# Patient Record
Sex: Female | Born: 1995 | Race: White | Hispanic: No | Smoking: Former smoker
Health system: Southern US, Community
[De-identification: ages and names within clinical notes are randomized; demographics above are authoritative.]

---
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
## 2016-07-05 IMAGING — CR DG CHEST 2V
2 series · 2 of 2 positions shown · non-contrast
Comparison: None

CLINICAL DATA: Chest pain for 1 week, pain moves from RIGHT to LEFT
anterior chest, former smoker

EXAM:
CHEST  2 VIEW

[w chest pa]
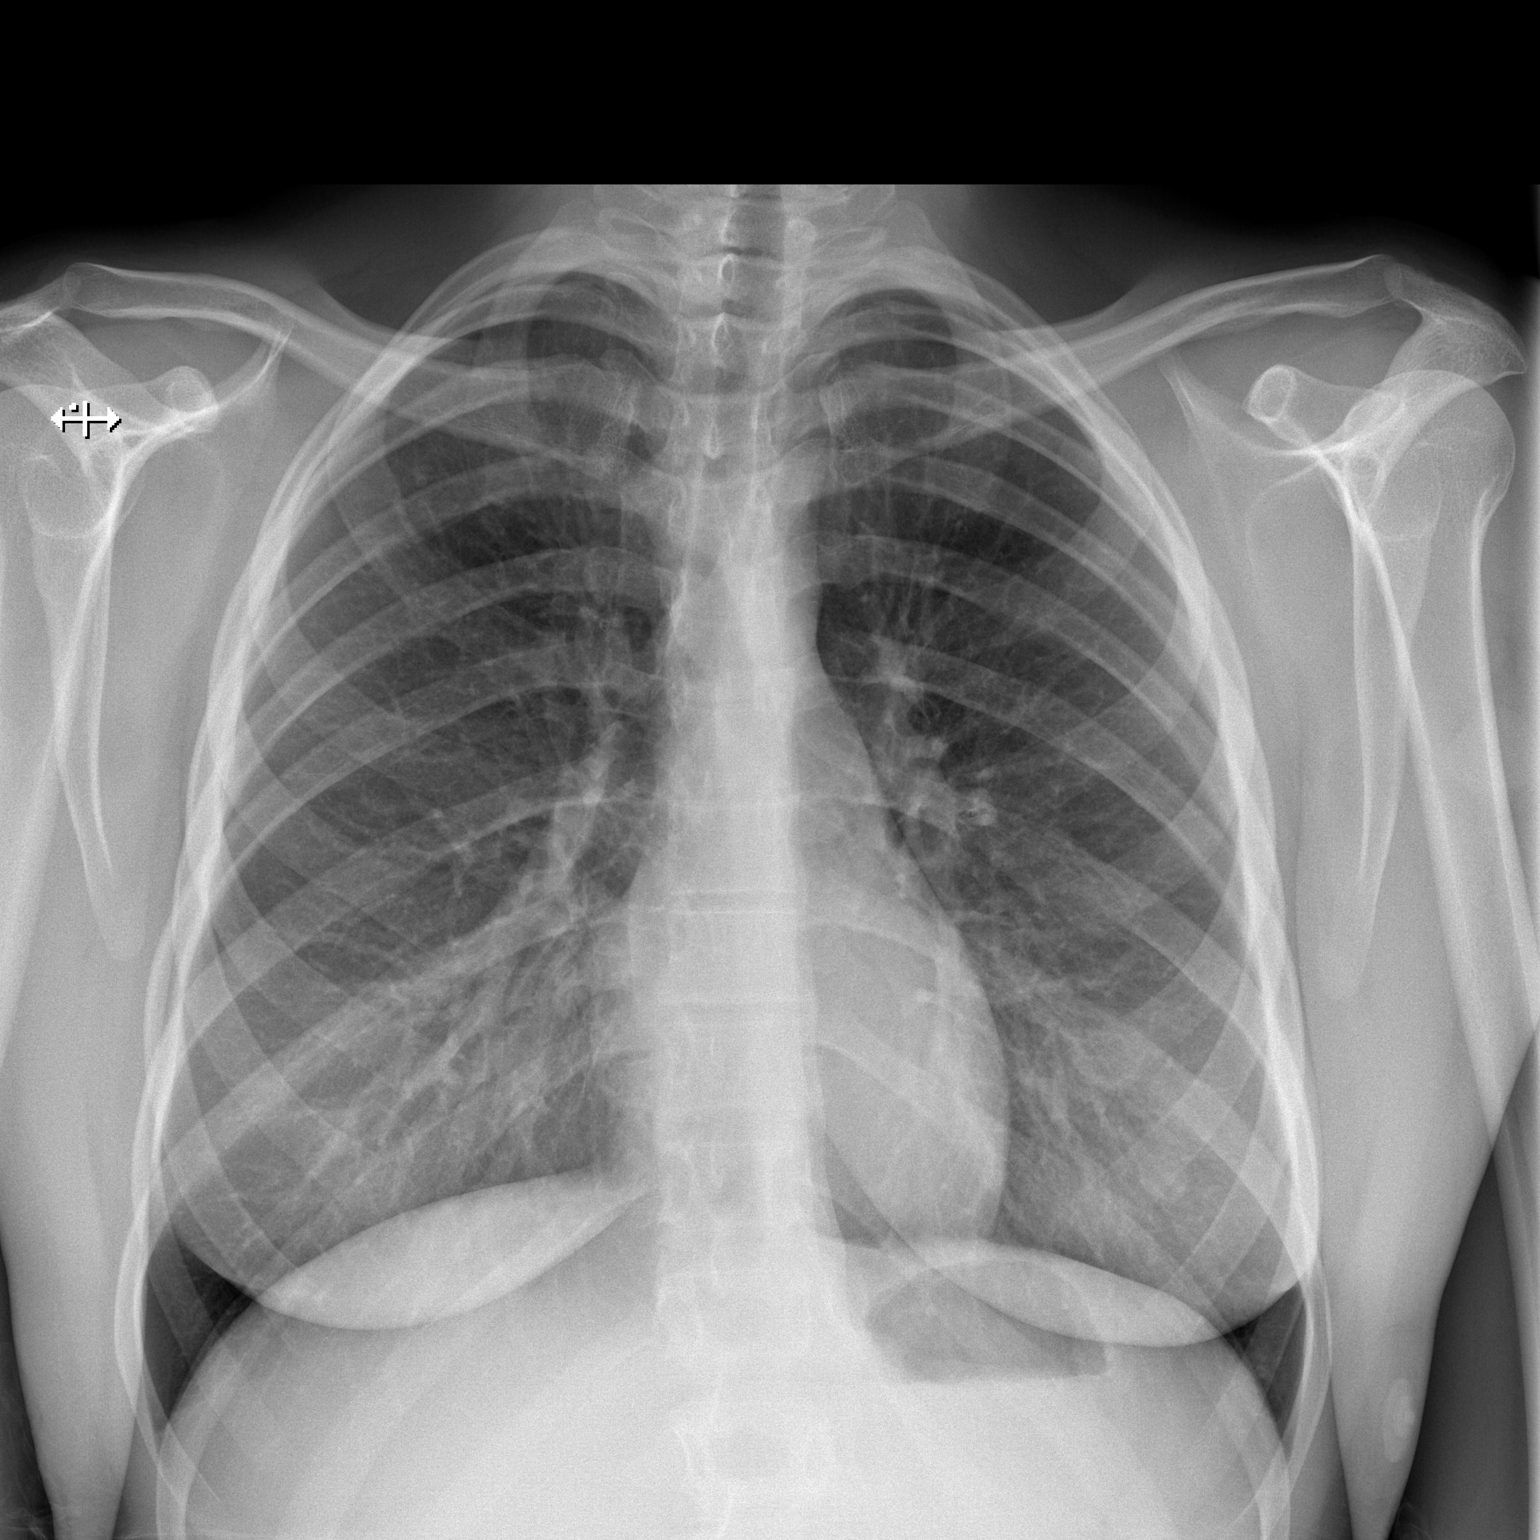

[w chest lat]
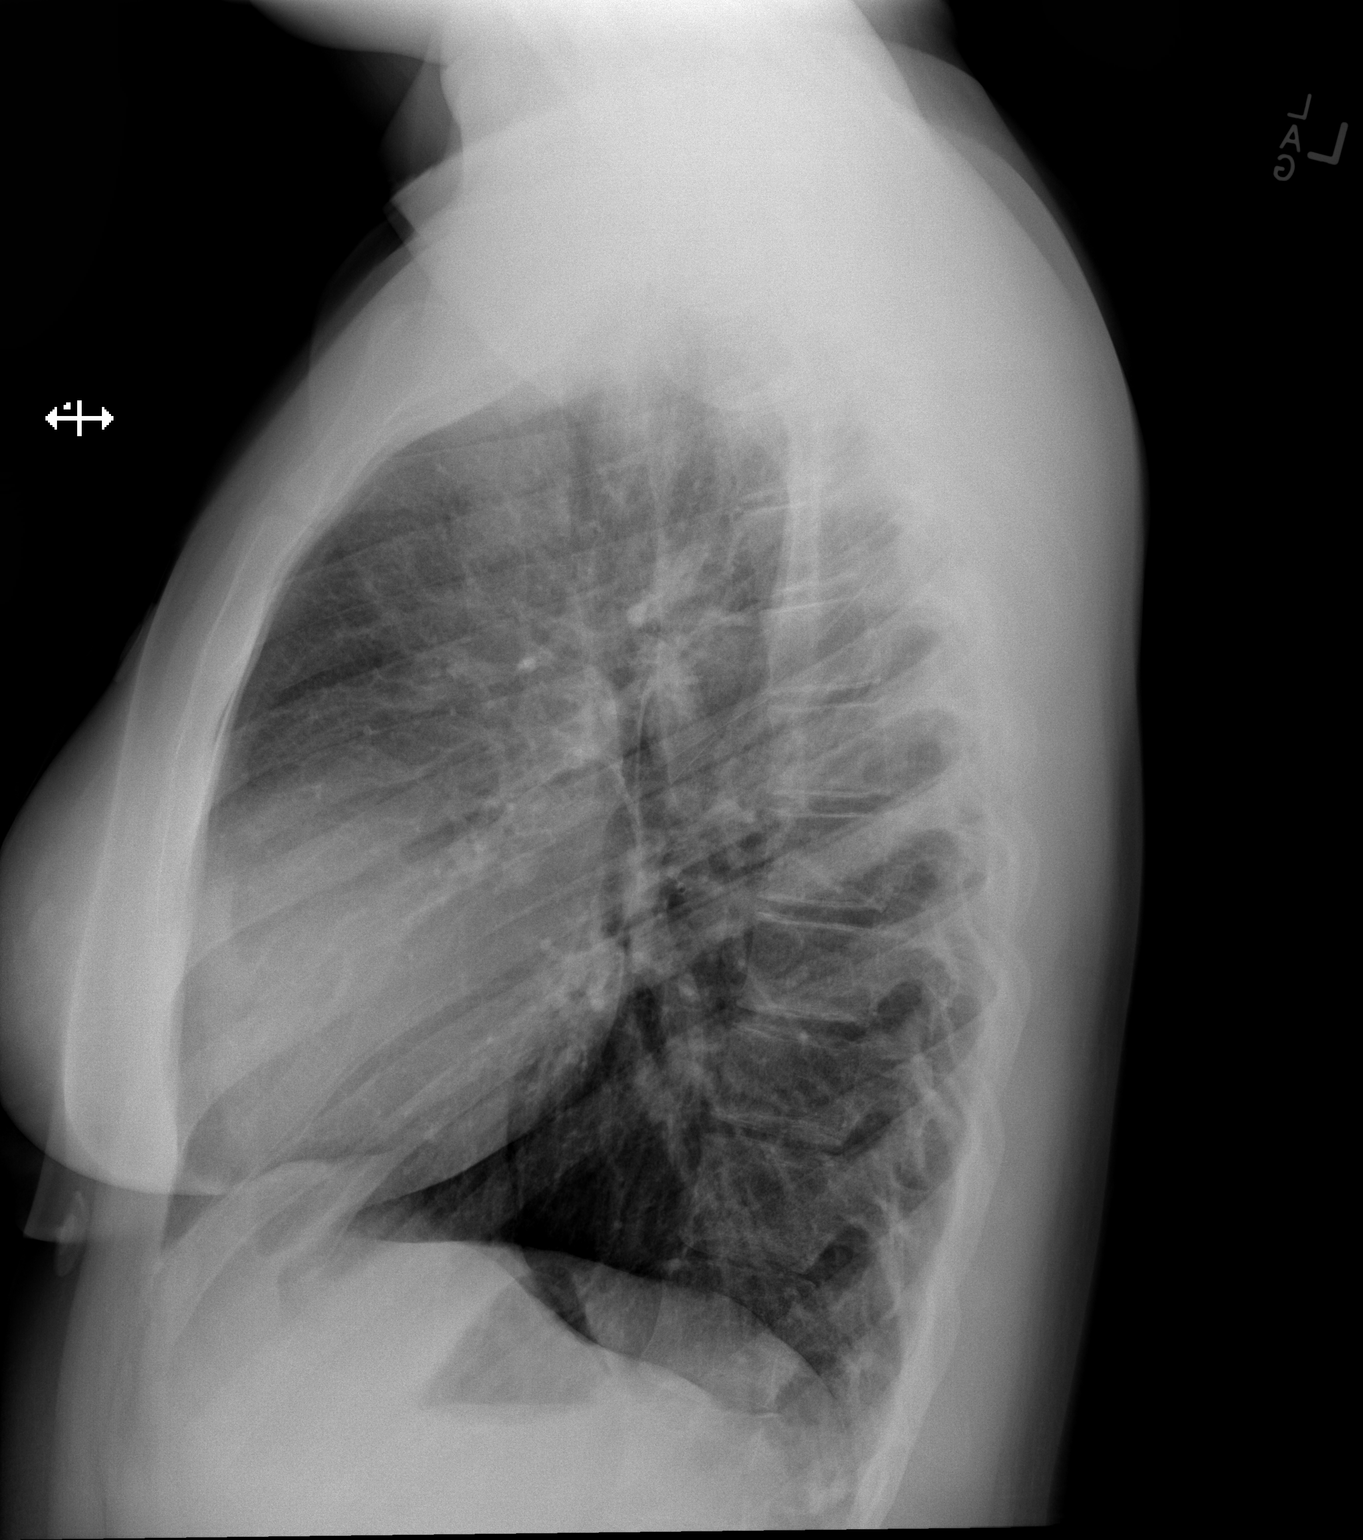

[2 of 2 positions shown; findings below may reference images not displayed]

FINDINGS: Normal heart size, mediastinal contours, and pulmonary vascularity.

Lungs clear.

No pneumothorax.

Bones unremarkable.
IMPRESSION: Normal exam.

## 2022-09-01 DIAGNOSIS — Z419 Encounter for procedure for purposes other than remedying health state, unspecified: Secondary | ICD-10-CM | POA: Diagnosis not present

## 2022-10-02 DIAGNOSIS — Z419 Encounter for procedure for purposes other than remedying health state, unspecified: Secondary | ICD-10-CM | POA: Diagnosis not present

## 2022-11-02 DIAGNOSIS — Z419 Encounter for procedure for purposes other than remedying health state, unspecified: Secondary | ICD-10-CM | POA: Diagnosis not present

## 2022-12-01 DIAGNOSIS — Z419 Encounter for procedure for purposes other than remedying health state, unspecified: Secondary | ICD-10-CM | POA: Diagnosis not present

## 2023-01-01 DIAGNOSIS — Z419 Encounter for procedure for purposes other than remedying health state, unspecified: Secondary | ICD-10-CM | POA: Diagnosis not present

## 2023-01-31 DIAGNOSIS — Z419 Encounter for procedure for purposes other than remedying health state, unspecified: Secondary | ICD-10-CM | POA: Diagnosis not present

## 2023-03-03 DIAGNOSIS — Z419 Encounter for procedure for purposes other than remedying health state, unspecified: Secondary | ICD-10-CM | POA: Diagnosis not present

## 2023-03-28 DIAGNOSIS — N760 Acute vaginitis: Secondary | ICD-10-CM | POA: Diagnosis not present

## 2023-03-28 DIAGNOSIS — Z113 Encounter for screening for infections with a predominantly sexual mode of transmission: Secondary | ICD-10-CM | POA: Diagnosis not present

## 2023-03-28 DIAGNOSIS — Z114 Encounter for screening for human immunodeficiency virus [HIV]: Secondary | ICD-10-CM | POA: Diagnosis not present

## 2023-04-02 DIAGNOSIS — J029 Acute pharyngitis, unspecified: Secondary | ICD-10-CM | POA: Diagnosis not present

## 2023-04-02 DIAGNOSIS — Z419 Encounter for procedure for purposes other than remedying health state, unspecified: Secondary | ICD-10-CM | POA: Diagnosis not present

## 2023-04-30 NOTE — Progress Notes (Unsigned)
    New patient visit   Patient: Michelle Mclean   DOB: 1996/08/28   27 y.o. Female  MRN: [id number] Visit Date: 05/01/2023  Today's healthcare provider: Alfredia Ferguson, PA-C   No chief complaint on file.  Subjective    Michelle Mclean is a 27 y.o. female who presents today as a new patient to establish care.  HPI  ***  No past medical history on file. No past surgical history on file. No family status information on file.   No family history on file. Social History   Socioeconomic History   Marital status: Married    Spouse name: Not on file   Number of children: Not on file   Years of education: Not on file   Highest education level: Not on file  Occupational History   Not on file  Tobacco Use   Smoking status: Former    Current packs/day: 0.00    Types: Cigarettes    Quit date: 07/09/2015    Years since quitting: 7.8   Smokeless tobacco: Never  Substance and Sexual Activity   Alcohol use: No   Drug use: No   Sexual activity: Yes    Birth control/protection: None  Other Topics Concern   Not on file  Social History Narrative   Not on file   Social Determinants of Health   Financial Resource Strain: Not on file  Food Insecurity: Not on file  Transportation Needs: Not on file  Physical Activity: Not on file  Stress: Not on file  Social Connections: Unknown (02/10/2022)   Received from [facility]   Social Network    Social Network: Not on file   Outpatient Medications Prior to Visit  Medication Sig   oxymetazoline (AFRIN NASAL SPRAY) 0.05 % nasal spray Place 1 spray into both nostrils 2 (two) times daily.   penicillin v potassium (VEETID) 500 MG tablet Take 1 tablet (500 mg total) by mouth 4 (four) times daily.   No facility-administered medications prior to visit.   No Known Allergies   There is no immunization history on file for this patient.  Health Maintenance  Topic Date Due   HPV VACCINES (1 - 3-dose series) Never done   HIV Screening   Never done   Hepatitis C Screening  Never done   DTaP/Tdap/Td (1 - Tdap) Never done   PAP-Cervical Cytology Screening  Never done   PAP SMEAR-Modifier  Never done   COVID-19 Vaccine (1 - 2023-24 season) Never done   INFLUENZA VACCINE  05/03/2023    Patient Care Team: Patient, No Pcp Per as PCP - General (General Practice)  Review of Systems  {Insert previous labs (optional):23779}  {See past labs  Heme  Chem  Endocrine  Serology  Results Review (optional):1}   Objective    There were no vitals taken for this visit. {Insert last BP/Wt (optional):23777}  {See vitals history (optional):1}  Physical Exam ***  Depression Screen     No data to display         No results found for any visits on 05/01/23.  Assessment & Plan     ***  No follow-ups on file.     {provider attestation***:1}   Alfredia Ferguson, PA-C   [facility] at [facility] [phone] (phone) [phone] (fax)  [facility]

## 2023-05-01 ENCOUNTER — Encounter: Payer: Self-pay | Admitting: Physician Assistant

## 2023-05-01 ENCOUNTER — Ambulatory Visit: Payer: Medicaid Other | Admitting: Physician Assistant

## 2023-05-01 VITALS — BP 116/72 | HR 67 | Temp 98.2°F | Resp 20 | Ht 72.0 in | Wt 197.0 lb

## 2023-05-01 DIAGNOSIS — F419 Anxiety disorder, unspecified: Secondary | ICD-10-CM | POA: Insufficient documentation

## 2023-05-01 DIAGNOSIS — R21 Rash and other nonspecific skin eruption: Secondary | ICD-10-CM | POA: Diagnosis not present

## 2023-05-01 DIAGNOSIS — Z13220 Encounter for screening for lipoid disorders: Secondary | ICD-10-CM | POA: Diagnosis not present

## 2023-05-01 DIAGNOSIS — R6884 Jaw pain: Secondary | ICD-10-CM

## 2023-05-01 DIAGNOSIS — R41840 Attention and concentration deficit: Secondary | ICD-10-CM

## 2023-05-01 LAB — LIPID PANEL
Cholesterol: 198 mg/dL (ref 0–200)
HDL: 62.1 mg/dL (ref >39.00)
LDL Cholesterol: 121 mg/dL — ABNORMAL HIGH (ref 0–99)
NonHDL: 135.97
Total CHOL/HDL Ratio: 3
Triglycerides: 73 mg/dL (ref 0.0–149.0)
VLDL: 14.6 mg/dL (ref 0.0–40.0)

## 2023-05-01 LAB — CBC WITH DIFFERENTIAL/PLATELET
Basophils Absolute: 0 10*3/uL (ref 0.0–0.1)
Basophils Relative: 1 % (ref 0.0–3.0)
Eosinophils Absolute: 0.1 10*3/uL (ref 0.0–0.7)
Eosinophils Relative: 2.3 % (ref 0.0–5.0)
HCT: 42.9 % (ref 36.0–46.0)
Hemoglobin: 14 g/dL (ref 12.0–15.0)
Lymphocytes Relative: 23.8 % (ref 12.0–46.0)
Lymphs Abs: 1 10*3/uL (ref 0.7–4.0)
MCHC: 32.7 g/dL (ref 30.0–36.0)
MCV: 89.6 fl (ref 78.0–100.0)
Monocytes Absolute: 0.5 10*3/uL (ref 0.1–1.0)
Monocytes Relative: 11.4 % (ref 3.0–12.0)
Neutro Abs: 2.6 10*3/uL (ref 1.4–7.7)
Neutrophils Relative %: 61.5 % (ref 43.0–77.0)
Platelets: 222 10*3/uL (ref 150.0–400.0)
RBC: 4.79 Mil/uL (ref 3.87–5.11)
RDW: 13.7 % (ref 11.5–15.5)
WBC: 4.3 10*3/uL (ref 4.0–10.5)

## 2023-05-01 LAB — COMPREHENSIVE METABOLIC PANEL
ALT: 14 U/L (ref 0–35)
AST: 15 U/L (ref 0–37)
Albumin: 4.5 g/dL (ref 3.5–5.2)
Alkaline Phosphatase: 41 U/L (ref 39–117)
BUN: 15 mg/dL (ref 6–23)
CO2: 30 mEq/L (ref 19–32)
Calcium: 9.3 mg/dL (ref 8.4–10.5)
Chloride: 101 mEq/L (ref 96–112)
Creatinine, Ser: 0.91 mg/dL (ref 0.40–1.20)
GFR: 87.07 mL/min (ref >60.00)
Glucose, Bld: 86 mg/dL (ref 70–99)
Potassium: 4.4 mEq/L (ref 3.5–5.1)
Sodium: 137 mEq/L (ref 135–145)
Total Bilirubin: 0.6 mg/dL (ref 0.2–1.2)
Total Protein: 7.3 g/dL (ref 6.0–8.3)

## 2023-05-01 NOTE — Assessment & Plan Note (Signed)
Refer to [facility] for formal evaluation.

## 2023-05-01 NOTE — Patient Instructions (Signed)
[facility] [address], [place], [place] [address]  Phone: [phone] Email: [email or web address]

## 2023-05-01 NOTE — Assessment & Plan Note (Signed)
-  Order HSV 1 and 2 IgG antibodies. -Advise patient to return for evaluation and possible culture if symptoms recur.

## 2023-05-01 NOTE — Assessment & Plan Note (Signed)
-  Refer to a dentist or oral surgeon for evaluation and possible treatment, including a mouth guard. -Advise patient to continue heat and massage therapy at home.

## 2023-05-01 NOTE — Assessment & Plan Note (Addendum)
History of severe panic attacks, previously treated with Lexapro. Currently not on medication and reports feeling equipped to handle symptoms. -No immediate intervention needed. Encourage continued self-care and coping strategies.

## 2023-05-03 DIAGNOSIS — Z419 Encounter for procedure for purposes other than remedying health state, unspecified: Secondary | ICD-10-CM | POA: Diagnosis not present

## 2023-06-03 DIAGNOSIS — Z419 Encounter for procedure for purposes other than remedying health state, unspecified: Secondary | ICD-10-CM | POA: Diagnosis not present

## 2023-07-03 DIAGNOSIS — Z419 Encounter for procedure for purposes other than remedying health state, unspecified: Secondary | ICD-10-CM | POA: Diagnosis not present

## 2023-08-03 DIAGNOSIS — Z419 Encounter for procedure for purposes other than remedying health state, unspecified: Secondary | ICD-10-CM | POA: Diagnosis not present

## 2023-08-09 ENCOUNTER — Other Ambulatory Visit (HOSPITAL_COMMUNITY)
Admission: RE | Admit: 2023-08-09 | Discharge: 2023-08-09 | Disposition: A | Discharge status: Home / Self Care | Payer: Medicaid Other | Source: Ambulatory Visit | Attending: Physician Assistant | Admitting: Physician Assistant

## 2023-08-09 ENCOUNTER — Ambulatory Visit (INDEPENDENT_AMBULATORY_CARE_PROVIDER_SITE_OTHER): Payer: Medicaid Other | Admitting: Physician Assistant

## 2023-08-09 ENCOUNTER — Ambulatory Visit: Payer: Medicaid Other | Admitting: Physician Assistant

## 2023-08-09 VITALS — BP 126/77 | HR 81 | Temp 98.5°F | Ht 72.0 in | Wt 205.0 lb

## 2023-08-09 DIAGNOSIS — H6502 Acute serous otitis media, left ear: Secondary | ICD-10-CM | POA: Diagnosis not present

## 2023-08-09 DIAGNOSIS — N898 Other specified noninflammatory disorders of vagina: Secondary | ICD-10-CM | POA: Diagnosis not present

## 2023-08-09 DIAGNOSIS — R300 Dysuria: Secondary | ICD-10-CM

## 2023-08-09 DIAGNOSIS — B3731 Acute candidiasis of vulva and vagina: Secondary | ICD-10-CM

## 2023-08-09 LAB — POC URINALSYSI DIPSTICK (AUTOMATED)
Blood, UA: NEGATIVE
Glucose, UA: NEGATIVE
Nitrite, UA: NEGATIVE
Protein, UA: NEGATIVE
Spec Grav, UA: 1.025 (ref 1.010–1.025)
Urobilinogen, UA: 0.2 U/dL
pH, UA: 5 (ref 5.0–8.0)

## 2023-08-09 MED ORDER — LORATADINE 10 MG PO TABS: 10.0000 mg | ORAL_TABLET | Freq: Every day | ORAL | 3 refills | Status: DC

## 2023-08-09 MED ORDER — AZELASTINE HCL 0.1 % NA SOLN: 2.0000 | Freq: Two times a day (BID) | NASAL | 0 refills | Status: DC

## 2023-08-09 MED ORDER — FLUCONAZOLE 150 MG PO TABS: 150.0000 mg | ORAL_TABLET | Freq: Once | ORAL | 0 refills | Status: AC

## 2023-08-09 NOTE — Progress Notes (Signed)
Established patient visit   Patient: Michelle Mclean   DOB: 09-Aug-1996   27 y.o. Female  MRN: [id number] Visit Date: 08/09/2023  Today's healthcare provider: Alfredia Ferguson, PA-C   Cc. Vaginal discharge, ear pain  Subjective    Patient reports 2 major complaints today:  Last week or so she has had a burning, itching of her vaginal area, with thick white chunky discharge.  Happened after intercourse.  She reports this happens intermittently but often gets better on its own.  Some dysuria when she urinates.  She is wondering about Ureaplasma/mycoplasma.  She also reports 2 to 3 days of left ear pain and muffled hearing.  Significant postnasal drip with bleeding and green mucus.  This happened after her events were cleaned.  Medications: No outpatient medications prior to visit.   No facility-administered medications prior to visit.   Review of Systems  Constitutional:  Negative for fatigue and fever.  HENT:  Positive for ear pain and postnasal drip.   Respiratory:  Negative for cough and shortness of breath.   Cardiovascular:  Negative for chest pain and leg swelling.  Gastrointestinal:  Negative for abdominal pain.  Genitourinary:  Positive for vaginal discharge.  Neurological:  Negative for dizziness and headaches.       Objective    BP 126/77   Pulse 81   Temp 98.5 F (36.9 C) (Oral)   Ht 6' (1.829 m)   Wt 205 lb (93 kg)   LMP 07/18/2023 (Approximate)   SpO2 98%   BMI 27.80 kg/m    Physical Exam Vitals reviewed.  Constitutional:      Appearance: She is not ill-appearing.  HENT:     Head: Normocephalic.     Right Ear: Tympanic membrane normal.     Ears:     Comments: Left TM is bulging but serous fluid is present.  Tympanic membrane has some injection to it.  Her deep left ear canal is slightly erythematous but there is no exudate. Eyes:     Conjunctiva/sclera: Conjunctivae normal.  Cardiovascular:     Rate and Rhythm: Normal rate.  Pulmonary:      Effort: Pulmonary effort is normal. No respiratory distress.  Abdominal:     General: Abdomen is flat.     Palpations: Abdomen is soft.     Tenderness: There is no abdominal tenderness. There is no guarding.  Neurological:     General: No focal deficit present.     Mental Status: She is alert and oriented to person, place, and time.  Psychiatric:        Mood and Affect: Mood normal.        Behavior: Behavior normal.      Results for orders placed or performed in visit on 08/09/23  POCT Urinalysis Dipstick (Automated)  Result Value Ref Range   Color, UA yellow    Clarity, UA clear    Glucose, UA Negative Negative   Bilirubin, UA Moderate    Ketones, UA Trace    Spec Grav, UA 1.025 1.010 - 1.025   Blood, UA Negative    pH, UA 5.0 5.0 - 8.0   Protein, UA Negative Negative   Urobilinogen, UA 0.2 0.2 or 1.0 E.U./dL   Nitrite, UA Negative    Leukocytes, UA Small (1+) (A) Negative    Assessment & Plan    1. Vaginal discharge2. Yeast vaginitis Empirically will treat as a yeast infection, prescribed Diflucan 1 dose she can repeat in 3 days  if symptoms are still present. Will do swab to rule out yeast BV/STI patient comfortable self swab today - Cervicovaginal ancillary only( [place]) - fluconazole (DIFLUCAN) 150 MG tablet; Take 1 tablet (150 mg total) by mouth once for 1 dose. If symptoms present after 3 days ok to take second dose  Dispense: 2 tablet; Refill: 0  3. Dysuria Likely secondary to yeast infection UA today with only small leukocytes.  Will send for culture and per patient preference mycoplasma Ureaplasma.  I did advise this is typically seen in more resistant UTI infections or with persistent symptoms. - POCT Urinalysis Dipstick (Automated) - Urine Culture - Mycoplasma / ureaplasma culture  4. Non-recurrent acute serous otitis media of left ear Advised antihistamine, azelastine nasal spray.  If symptoms persist advised patient to contact office and we will send an  antibiotic Appears allergic secondary to vent cleaning - loratadine (CLARITIN) 10 MG tablet; Take 1 tablet (10 mg total) by mouth daily.  Dispense: 30 tablet; Refill: 3 - azelastine (ASTELIN) 0.1 % nasal spray; Place 2 sprays into both nostrils 2 (two) times daily. Use in each nostril as directed  Dispense: 30 mL; Refill: 0   Return if symptoms worsen or fail to improve.      Alfredia Ferguson, PA-C  [facility] at [facility] [phone] (phone) [phone] (fax)  [facility]

## 2023-08-09 NOTE — Patient Instructions (Addendum)
Oral surgery:  [phone]   ADHD -[phone]

## 2023-08-10 LAB — URINE CULTURE
MICRO NUMBER:: 15700242
Result:: NO GROWTH
SPECIMEN QUALITY:: ADEQUATE

## 2023-08-13 LAB — CERVICOVAGINAL ANCILLARY ONLY
Bacterial Vaginitis (gardnerella): NEGATIVE
Candida Glabrata: NEGATIVE
Candida Vaginitis: POSITIVE — AB
Chlamydia: NEGATIVE
Comment: NEGATIVE
Comment: NEGATIVE
Comment: NEGATIVE
Comment: NEGATIVE
Comment: NEGATIVE
Comment: NORMAL
Neisseria Gonorrhea: NEGATIVE
Trichomonas: NEGATIVE

## 2023-08-19 LAB — MYCOPLASMA / UREAPLASMA CULTURE

## 2023-08-20 ENCOUNTER — Other Ambulatory Visit: Payer: Self-pay | Admitting: Physician Assistant

## 2023-08-20 DIAGNOSIS — A493 Mycoplasma infection, unspecified site: Secondary | ICD-10-CM

## 2023-08-20 MED ORDER — DOXYCYCLINE HYCLATE 100 MG PO TABS: 100.0000 mg | ORAL_TABLET | Freq: Two times a day (BID) | ORAL | 0 refills | Status: AC

## 2023-09-02 DIAGNOSIS — Z419 Encounter for procedure for purposes other than remedying health state, unspecified: Secondary | ICD-10-CM | POA: Diagnosis not present

## 2023-10-03 DIAGNOSIS — Z419 Encounter for procedure for purposes other than remedying health state, unspecified: Secondary | ICD-10-CM | POA: Diagnosis not present

## 2023-11-03 DIAGNOSIS — Z419 Encounter for procedure for purposes other than remedying health state, unspecified: Secondary | ICD-10-CM | POA: Diagnosis not present

## 2023-11-09 NOTE — Progress Notes (Signed)
 Established patient visit   Patient: Michelle Mclean   DOB: [birth year]   28 y.o. Female  MRN: [id number] Visit Date: 11/12/2023  Today's healthcare provider: Trenton Frock, PA-C   Cc. Several concerns  Subjective     Discussed the use of AI scribe software for clinical note transcription with the patient, who gave verbal consent to proceed.  History of Present Illness   The patient presents with multiple concerns. She reports three raised skin bumps, one each on the left thigh, left shoulder, and right side of her face.  She also expresses anxiety about potential parasites due to unusual sugar cravings and teeth grinding, although there is no recent history of travel abroad or high risk food consumption. No recent weight loss.  She is also concerned about increasing alcohol consumption and nicotine cravings. She admits to drinking socially and sometimes consuming multiple drinks in one sitting. She also reports cravings for nicotine, particularly during periods of stress, although she has not bought a pack of cigarettes. 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  She also reports severe symptoms of premenstrual dysphoric disorder (PMDD). She describes feeling extremely irritable, angry, and out of character during the luteal phase of her menstrual cycle, to the point of considering ending relationships and experiencing thoughts of self-harm. She has tried over-the-counter treatments with some perceived improvement but is seeking a more effective solution.  She has a history of panic attacks and has previously been on Lexapro, but is not currently on any anxiety medication. She is not currently in therapy but is open to the idea. Denies any recent panic attacks.     Medications: Outpatient Medications Prior to Visit  Medication Sig   [DISCONTINUED] azelastine  (ASTELIN ) 0.1 % nasal spray Place 2 sprays  into both nostrils 2 (two) times daily. Use in each nostril as directed   [DISCONTINUED] loratadine  (CLARITIN ) 10 MG tablet Take 1 tablet (10 mg total) by mouth daily.   No facility-administered medications prior to visit.    Review of Systems  Constitutional:  Negative for fatigue and fever.  Respiratory:  Negative for cough and shortness of breath.   Cardiovascular:  Negative for chest pain and leg swelling.  Gastrointestinal:  Negative for abdominal pain.  Skin:  Positive for color change.  Neurological:  Negative for dizziness and headaches.  Psychiatric/Behavioral:  The patient is nervous/anxious.        Objective    BP 114/73   Pulse 69   Temp 98.3 F (36.8 C) (Oral)   Ht 6' (1.829 m)   Wt 199 lb 8 oz (90.5 kg)   SpO2 98%   BMI 27.06 kg/m    Physical Exam Vitals reviewed.  Constitutional:      Appearance: She is not ill-appearing.  HENT:     Head: Normocephalic.  Eyes:     Conjunctiva/sclera: Conjunctivae normal.  Cardiovascular:     Rate and Rhythm: Normal rate.  Pulmonary:     Effort: Pulmonary effort is normal. No respiratory distress.  Skin:    Comments: L thigh in pts tattoo is a 2 mm well circumscribed, raised, brown, all same color lesion w/ no irregular edges.   L shoulder < 1 mm raised slightly pigmented lesion   R temple, 1 mm flat hyperpigmented lesion  Neurological:     General: No focal deficit present.     Mental Status: She is alert  and oriented to person, place, and time.  Psychiatric:        Mood and Affect: Mood normal.        Behavior: Behavior normal.      Left ant thigh  No results found for any visits on 11/12/23.  Assessment & Plan    Anxiety Assessment & Plan: She does have several dx criteria for PMDD. History of self-harm thoughts during this period. No active SI. Discussed alternative treatments including continuous birth control, daily anxiety medication, and luteal phase-specific antidepressants. Patient prefers to  avoid continuous birth control and daily anxiety medication due to concerns about side effects and body regulation. - Prescribe Buspar  5 mg, to be taken twice daily during the luteal phase - Follow up after next menstrual cycle to assess response to Buspar   Referring to therapy   Orders: -     Ambulatory referral to Psychology -     busPIRone  HCl; Take 1 tablet (5 mg total) by mouth 2 (two) times daily.  Dispense: 60 tablet; Refill: 0  Pre-menstrual mood disorder -     Ambulatory referral to Psychology -     busPIRone  HCl; Take 1 tablet (5 mg total) by mouth 2 (two) times daily.  Dispense: 60 tablet; Refill: 0  Attention deficit Assessment & Plan: Previous referral did not take her insurance. Referring to another psychiatry group for adhd eval  Orders: -     Ambulatory referral to Psychiatry -     Ambulatory referral to Psychology  Alcohol use Assessment & Plan: - Recommend therapy to address underlying stress and coping mechanisms   Tobacco use Assessment & Plan:  Recommend therapy to address stress and coping mechanisms - Encourage avoidance of vaping and smoking   Skin lesion   Leg lesion is the only one I would monitor, see pictured, No alarming features to refer for bx. Advised pt self monitor. Other lesions, L shoulder and R temple, normal macules.   Reviewed what would be a risk factor for a parasite infection, and what symptoms would be. Reassured no testing or treatment needed.  Follow-up - Schedule follow-up appointment after the next menstrual cycle - Contact with any questions or concerns before the next appointment.        Return in about 4 weeks (around 12/10/2023) for anxiety.       Trenton Frock, PA-C  [facility] at [facility] [phone] (phone) [phone] (fax)  [facility]

## 2023-11-12 ENCOUNTER — Encounter: Payer: Self-pay | Admitting: Physician Assistant

## 2023-11-12 ENCOUNTER — Ambulatory Visit (INDEPENDENT_AMBULATORY_CARE_PROVIDER_SITE_OTHER): Payer: Medicaid Other | Admitting: Physician Assistant

## 2023-11-12 VITALS — BP 114/73 | HR 69 | Temp 98.3°F | Ht 72.0 in | Wt 199.5 lb

## 2023-11-12 DIAGNOSIS — F419 Anxiety disorder, unspecified: Secondary | ICD-10-CM | POA: Diagnosis not present

## 2023-11-12 DIAGNOSIS — R41840 Attention and concentration deficit: Secondary | ICD-10-CM | POA: Diagnosis not present

## 2023-11-12 DIAGNOSIS — F3281 Premenstrual dysphoric disorder: Secondary | ICD-10-CM

## 2023-11-12 DIAGNOSIS — Z789 Other specified health status: Secondary | ICD-10-CM | POA: Diagnosis not present

## 2023-11-12 DIAGNOSIS — L989 Disorder of the skin and subcutaneous tissue, unspecified: Secondary | ICD-10-CM | POA: Diagnosis not present

## 2023-11-12 DIAGNOSIS — Z720 Tobacco use: Secondary | ICD-10-CM | POA: Insufficient documentation

## 2023-11-12 MED ORDER — BUSPIRONE HCL 5 MG PO TABS: 5.0000 mg | ORAL_TABLET | Freq: Two times a day (BID) | ORAL | 0 refills | Status: DC

## 2023-11-12 NOTE — Assessment & Plan Note (Signed)
 Previous referral did not take her insurance. Referring to another psychiatry group for adhd eval

## 2023-11-12 NOTE — Assessment & Plan Note (Signed)
 Recommend therapy to address stress and coping mechanisms - Encourage avoidance of vaping and smoking

## 2023-11-12 NOTE — Assessment & Plan Note (Signed)
-   Recommend therapy to address underlying stress and coping mechanisms

## 2023-11-12 NOTE — Assessment & Plan Note (Signed)
 She does have several dx criteria for PMDD. History of self-harm thoughts during this period. No active SI. Discussed alternative treatments including continuous birth control, daily anxiety medication, and luteal phase-specific antidepressants. Patient prefers to avoid continuous birth control and daily anxiety medication due to concerns about side effects and body regulation. - Prescribe Buspar  5 mg, to be taken twice daily during the luteal phase - Follow up after next menstrual cycle to assess response to Buspar   Referring to therapy

## 2023-11-27 ENCOUNTER — Ambulatory Visit: Payer: Medicaid Other | Admitting: Psychology

## 2023-11-27 DIAGNOSIS — F902 Attention-deficit hyperactivity disorder, combined type: Secondary | ICD-10-CM | POA: Diagnosis not present

## 2023-11-27 DIAGNOSIS — F603 Borderline personality disorder: Secondary | ICD-10-CM | POA: Diagnosis not present
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
## 2023-12-01 DIAGNOSIS — Z419 Encounter for procedure for purposes other than remedying health state, unspecified: Secondary | ICD-10-CM | POA: Diagnosis not present

## 2023-12-19 ENCOUNTER — Ambulatory Visit (INDEPENDENT_AMBULATORY_CARE_PROVIDER_SITE_OTHER): Payer: Medicaid Other | Admitting: Psychology

## 2023-12-19 DIAGNOSIS — F4323 Adjustment disorder with mixed anxiety and depressed mood: Secondary | ICD-10-CM

## 2023-12-19 DIAGNOSIS — F902 Attention-deficit hyperactivity disorder, combined type: Secondary | ICD-10-CM | POA: Diagnosis not present
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
## 2023-12-24 ENCOUNTER — Ambulatory Visit (INDEPENDENT_AMBULATORY_CARE_PROVIDER_SITE_OTHER): Payer: Medicaid Other | Admitting: Physician Assistant

## 2023-12-24 ENCOUNTER — Encounter: Payer: Self-pay | Admitting: Physician Assistant

## 2023-12-24 VITALS — BP 127/81 | HR 84 | Ht 72.0 in | Wt 200.0 lb

## 2023-12-24 DIAGNOSIS — F419 Anxiety disorder, unspecified: Secondary | ICD-10-CM

## 2023-12-24 DIAGNOSIS — Z7289 Other problems related to lifestyle: Secondary | ICD-10-CM | POA: Insufficient documentation

## 2023-12-24 DIAGNOSIS — R41840 Attention and concentration deficit: Secondary | ICD-10-CM | POA: Diagnosis not present

## 2023-12-24 NOTE — Assessment & Plan Note (Signed)
 We were treating suspected PMDD w/ luteal phase buspar, pt thinks she felt better. Will try again this next cycle. Given self reported high anxiety, suggested trying buspar bid daily. Pt will consider.

## 2023-12-24 NOTE — Progress Notes (Signed)
 Established patient visit   Patient: Michelle Mclean   DOB: 27-Oct-1995   28 y.o. Female  MRN: [id number] Visit Date: 12/24/2023  Today's healthcare provider: Alfredia Ferguson, PA-C   Cc. Anxiety f/u  Subjective     Anxiety, Follow-up  She was last seen for anxiety last month, we started buspar 5 mg BID, during her luteal phase.   She has started therapy and is enjoying it.  GAD-7 Results    12/24/2023    2:32 PM 05/01/2023    9:28 AM  GAD-7 Generalized Anxiety Disorder Screening Tool  1. Feeling Nervous, Anxious, or on Edge 1 1  2. Not Being Able to Stop or Control Worrying 0 0  3. Worrying Too Much About Different Things 0 0  4. Trouble Relaxing 0 0  5. Being So Restless it's Hard To Sit Still 0 0  6. Becoming Easily Annoyed or Irritable 1 0  7. Feeling Afraid As If Something Awful Might Happen 0 1  Total GAD-7 Score 2 2  Difficulty At Work, Home, or Getting  Along With Others? Not difficult at all Not difficult at all    PHQ-9 Scores    12/24/2023    2:31 PM 05/01/2023    9:27 AM  PHQ9 SCORE ONLY  PHQ-9 Total Score 3 0   --------------------------------------------------------------------------------------------------- Discussed the use of AI scribe software for clinical note transcription with the patient, who gave verbal consent to proceed.  History of Present Illness   She reports a recent episode of near syncope, which she attributes to not eating and vaping. The patient describes the episode as feeling like "ice shot through my veins" and resulted in her almost blacking out.   The patient also discusses her struggles with nicotine addiction. She has been vaping but is considering switching back to cigarettes as a means of weaning off nicotine. Medications: Outpatient Medications Prior to Visit  Medication Sig   busPIRone (BUSPAR) 5 MG tablet Take 1 tablet (5 mg total) by mouth 2 (two) times daily.   No facility-administered medications prior to visit.    Review of Systems  Constitutional:  Negative for fatigue and fever.  Respiratory:  Negative for cough and shortness of breath.   Cardiovascular:  Negative for chest pain and leg swelling.  Gastrointestinal:  Negative for abdominal pain.  Neurological:  Negative for dizziness and headaches.       Objective    BP 127/81   Pulse 84   Ht 6' (1.829 m)   Wt 200 lb (90.7 kg)   BMI 27.12 kg/m    Physical Exam Vitals reviewed.  Constitutional:      Appearance: She is not ill-appearing.  HENT:     Head: Normocephalic.  Eyes:     Conjunctiva/sclera: Conjunctivae normal.  Cardiovascular:     Rate and Rhythm: Normal rate.  Pulmonary:     Effort: Pulmonary effort is normal. No respiratory distress.  Neurological:     Mental Status: She is alert and oriented to person, place, and time.  Psychiatric:        Mood and Affect: Mood normal.        Behavior: Behavior normal.      No results found for any visits on 12/24/23.  Assessment & Plan    Attention deficit Assessment & Plan: Pt reports last psych office had a year long waiting list. Referring to mood treatment center to see if they could dx and treat. High suspicion of adhd needing  adult dx   Orders: -     Ambulatory referral to Psychiatry  Anxiety Assessment & Plan: We were treating suspected PMDD w/ luteal phase buspar, pt thinks she felt better. Will try again this next cycle. Given self reported high anxiety, suggested trying buspar bid daily. Pt will consider.  Orders: -     Ambulatory referral to Psychiatry  Current every day vaping Assessment & Plan: Encouraged cessation. Near syncopal episode could have been 2/2 dehydration, coffee + nicotine intake, and skipping breakfast. Advised pt if it occurs again to message and I would order labs     Return in about 6 months (around 06/25/2024), or if symptoms worsen or fail to improve, for anxiety.       Alfredia Ferguson, PA-C  [facility]  [facility] [phone] (phone) [phone] (fax)  [facility]

## 2023-12-24 NOTE — Assessment & Plan Note (Signed)
 Encouraged cessation. Near syncopal episode could have been 2/2 dehydration, coffee + nicotine intake, and skipping breakfast. Advised pt if it occurs again to message and I would order labs

## 2023-12-24 NOTE — Assessment & Plan Note (Signed)
 Pt reports last psych office had a year long waiting list. Referring to mood treatment center to see if they could dx and treat. High suspicion of adhd needing adult dx

## 2023-12-28 ENCOUNTER — Encounter: Payer: Self-pay | Admitting: Physician Assistant

## 2024-01-01 ENCOUNTER — Other Ambulatory Visit: Payer: Self-pay | Admitting: Physician Assistant

## 2024-01-01 DIAGNOSIS — F419 Anxiety disorder, unspecified: Secondary | ICD-10-CM

## 2024-01-01 DIAGNOSIS — R41840 Attention and concentration deficit: Secondary | ICD-10-CM

## 2024-01-12 DIAGNOSIS — Z419 Encounter for procedure for purposes other than remedying health state, unspecified: Secondary | ICD-10-CM | POA: Diagnosis not present

## 2024-01-30 ENCOUNTER — Ambulatory Visit: Payer: Medicaid Other | Admitting: Psychology

## 2024-02-06 ENCOUNTER — Ambulatory Visit (INDEPENDENT_AMBULATORY_CARE_PROVIDER_SITE_OTHER): Admitting: Psychology

## 2024-02-06 DIAGNOSIS — F4323 Adjustment disorder with mixed anxiety and depressed mood: Secondary | ICD-10-CM

## 2024-02-06 DIAGNOSIS — F902 Attention-deficit hyperactivity disorder, combined type: Secondary | ICD-10-CM

## 2024-02-07 NOTE — Progress Notes (Signed)
 [facility] Counselor/Therapist Progress Note  Patient ID: Michelle Mclean, MRN: [id number],    Date: 02/06/2024  Time Spent: 60 minutes Time In:  1:00  Time out:  2:00  Treatment Type: Individual Therapy  Reported Symptoms: history of cutting, sadness,   Mental Status Exam: Appearance:  goth    Behavior: Appropriate  Motor: Normal  Speech/Language:  Normal Rate  Affect: Blunt  Mood: pleasant  Thought process: normal  Thought content:   WNL  Sensory/Perceptual disturbances:   WNL  Orientation: oriented to person, place, time/date, and situation  Attention: Good  Concentration: Good  Memory: WNL  Fund of knowledge:  Good  Insight:   Good  Judgment:  Fair  Impulse Control: Fair   Risk Assessment: Danger to Self:  No Self-injurious Behavior: No Danger to Others: No Duty to Warn:no Physical Aggression / Violence:No  Access to Firearms a concern: No  Gang Involvement:No   Subjective: The patient came in for an individual therapy session in the office today.  The patient presents with a blunted affect and her mood is pleasant.  The patient reports that she had a  confrontation with her mother since I saw her last and it sounds like she handled the situation pretty well.  The patient does seem to have a lot of issues from her childhood that we probably need to sort through.  She does seem to be managing the present pretty well and is gardening and doing some things that are healthy in regard to taking care of herself.  We talked a little bit about her relationship with her partner and apparently she is having some dissociative with episodes and some difficulty when they are having intimacy which probably relates to childhood trauma.  We talked briefly about EMDR and a neural network map but I am intending to go relatively slowly as I am not sure how much she can tolerate or how how much it will  make her decompensate.  We will continue  to explore this as an  option. Interventions: Cognitive Behavioral Therapy, Dialectical Behavioral Therapy, Eye Movement Desensitization and Reprocessing (EMDR), and Insight-Oriented  Diagnosis:Attention deficit hyperactivity disorder (ADHD), combined type  Adjustment disorder with mixed anxiety and depressed mood  Plan: Client Abilities/Strengths  Intelligent,  motivated, insightful  Client Treatment Preferences  Outpatient Individual therapy  Client Statement of Needs  "I need some help with my anxiety and ADD."  Treatment Level  Outpatient Individual therapy  Symptoms  Excessive and/or unrealistic worry that is difficult to control occurring more days than not for at least 6  months about a number of events or activities.: No Description Entered (Status: maintained). Has been  exposed to a traumatic event involving actual or perceived threat of death or serious injury.: No  Description Entered (Status: maintained). Hypervigilance (e.g., feeling constantly on edge,  experiencing concentration difficulties, having trouble falling or staying asleep, exhibiting a general  state of irritability).: No Description Entered (Status: maintained). Impairment in social, occupational,  or other areas of functioning.: No Description Entered (Status: maintained).  Problems Addressed  Adjustment disorder with mixed anxiety and depression  Goals 1. Eliminate or reduce the negative impact trauma related symptoms have  on social, occupational, and family functioning. Objective Participate in Eye Movement Desensitization and Reprocessing (EMDR) to reduce emotional distress  related to traumatic thoughts, feelings, and images. Target Date:  12/18/2024 Frequency: Bi Weekly  Progress: 0 Modality: individual Related Interventions 1. Utilize Eye Movement Desensitization and Reprocessing (EMDR) to reduce the  client's  emotional reactivity to the traumatic event and reduce PTSD symptoms. 2. Stabilize anxiety and depression  level while increasing ability to function on a daily  basis. Objective Learn and implement problem-solving strategies for realistically addressing worries. Target Date: 12/18/2024 Frequency: Bi Weekly Progress: 0 Modality: individual Related Interventions 1. 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

## 2024-02-10 ENCOUNTER — Encounter: Payer: Self-pay | Admitting: Physician Assistant

## 2024-02-10 ENCOUNTER — Telehealth: Admitting: Physician Assistant

## 2024-02-10 DIAGNOSIS — J028 Acute pharyngitis due to other specified organisms: Secondary | ICD-10-CM

## 2024-02-10 DIAGNOSIS — B9689 Other specified bacterial agents as the cause of diseases classified elsewhere: Secondary | ICD-10-CM

## 2024-02-10 MED ORDER — AMOXICILLIN 500 MG PO CAPS: 500.0000 mg | ORAL_CAPSULE | Freq: Two times a day (BID) | ORAL | 0 refills | Status: AC

## 2024-02-10 NOTE — Progress Notes (Signed)

## 2024-02-11 DIAGNOSIS — Z419 Encounter for procedure for purposes other than remedying health state, unspecified: Secondary | ICD-10-CM | POA: Diagnosis not present

## 2024-02-20 ENCOUNTER — Ambulatory Visit (INDEPENDENT_AMBULATORY_CARE_PROVIDER_SITE_OTHER): Admitting: Psychology

## 2024-02-20 DIAGNOSIS — F4323 Adjustment disorder with mixed anxiety and depressed mood: Secondary | ICD-10-CM | POA: Diagnosis not present

## 2024-02-20 DIAGNOSIS — F902 Attention-deficit hyperactivity disorder, combined type: Secondary | ICD-10-CM | POA: Diagnosis not present

## 2024-02-21 NOTE — Progress Notes (Signed)
 [facility] Counselor/Therapist Progress Note  Patient ID: Michelle Mclean, MRN: [id number],    Date: 02/20/2024  Time Spent: 53 minutes Time In:  12:07 Time out:  1:00  Treatment Type: Individual Therapy  Reported Symptoms: history of cutting, sadness,   Mental Status Exam: Appearance:  goth    Behavior: Appropriate  Motor: Normal  Speech/Language:  Normal Rate  Affect: Blunt  Mood: pleasant  Thought process: normal  Thought content:   WNL  Sensory/Perceptual disturbances:   WNL  Orientation: oriented to person, place, time/date, and situation  Attention: Good  Concentration: Good  Memory: WNL  Fund of knowledge:  Good  Insight:   Good  Judgment:  Fair  Impulse Control: Fair   Risk Assessment: Danger to Self:  No Self-injurious Behavior: No Danger to Others: No Duty to Warn:no Physical Aggression / Violence:No  Access to Firearms a concern: No  Gang Involvement:No   Subjective: The patient came in for an individual therapy session in the office today.  The patient presents with a blunted affect and her mood is pleasant.  It was a little late getting here because her air-conditioning people were there.  The patient reports that things have been going okay.  Today we decided to talk about what she specifically wants to work on in therapy as she has been pretty vague in what it is that she wants to accomplish.  We talked about the possibility of doing EMDR and I explained to her that we needed really a target in order to be able to target her reactivity.  We talked about 2 different things that we possibly could do EMDR on and 1 of those was her reaction after sex and the other is her drive to better herself all the time and not be comfortable with where she is.  I asked that she think about this before she comes back the next time. Interventions: Cognitive Behavioral Therapy, Dialectical Behavioral Therapy, Eye Movement Desensitization and Reprocessing (EMDR),  and Insight-Oriented  Diagnosis:Attention deficit hyperactivity disorder (ADHD), combined type  Adjustment disorder with mixed anxiety and depressed mood  Plan: Client Abilities/Strengths  Intelligent,  motivated, insightful  Client Treatment Preferences  Outpatient Individual therapy  Client Statement of Needs  "I need some help with my anxiety and ADD."  Treatment Level  Outpatient Individual therapy  Symptoms  Excessive and/or unrealistic worry that is difficult to control occurring more days than not for at least 6  months about a number of events or activities.: No Description Entered (Status: maintained). Has been  exposed to a traumatic event involving actual or perceived threat of death or serious injury.: No  Description Entered (Status: maintained). Hypervigilance (e.g., feeling constantly on edge,  experiencing concentration difficulties, having trouble falling or staying asleep, exhibiting a general  state of irritability).: No Description Entered (Status: maintained). Impairment in social, occupational,  or other areas of functioning.: No Description Entered (Status: maintained).  Problems Addressed  Adjustment disorder with mixed anxiety and depression  Goals 1. Eliminate or reduce the negative impact trauma related symptoms have  on social, occupational, and family functioning. Objective Participate in Eye Movement Desensitization and Reprocessing (EMDR) to reduce emotional distress  related to traumatic thoughts, feelings, and images. Target Date:  12/18/2024 Frequency: Bi Weekly  Progress: 0 Modality: individual Related Interventions 1. Utilize Eye Movement Desensitization and Reprocessing (EMDR) to reduce the client's  emotional reactivity to the traumatic event and reduce PTSD symptoms. 2. Stabilize anxiety and depression level while increasing ability  to function on a daily  basis. Objective Learn and implement problem-solving strategies for realistically  addressing worries. Target Date: 12/18/2024 Frequency: Bi Weekly Progress: 0 Modality: individual Related Interventions 1. 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

## 2024-03-07 ENCOUNTER — Ambulatory Visit: Admitting: Psychology

## 2024-03-07 DIAGNOSIS — F4323 Adjustment disorder with mixed anxiety and depressed mood: Secondary | ICD-10-CM | POA: Diagnosis not present

## 2024-03-07 DIAGNOSIS — F902 Attention-deficit hyperactivity disorder, combined type: Secondary | ICD-10-CM

## 2024-03-07 NOTE — Progress Notes (Signed)
 [facility] Counselor/Therapist Progress Note  Patient ID: Michelle Mclean, MRN: [id number],    Date: 03/07/2024  Time Spent: 60 minutes Time In:  2:00 Time out:  3:00  Treatment Type: Individual Therapy  Reported Symptoms: history of cutting, sadness,   Mental Status Exam: Appearance:  goth    Behavior: Appropriate  Motor: Normal  Speech/Language:  Normal Rate  Affect: Blunt  Mood: anxious  Thought process: normal  Thought content:   WNL  Sensory/Perceptual disturbances:   WNL  Orientation: oriented to person, place, time/date, and situation  Attention: Good  Concentration: Good  Memory: WNL  Fund of knowledge:  Good  Insight:   Good  Judgment:  Fair  Impulse Control: Fair   Risk Assessment: Danger to Self:  No Self-injurious Behavior: No Danger to Others: No Duty to Warn:no Physical Aggression / Violence:No  Access to Firearms a concern: No  Gang Involvement:No   Subjective: The patient came in for an individual therapy session in the office today.  The patient presents with a blunted affect and her mood is anxious today.  The patient says that she did the homework that we talked about doing her doing.  She reports that the things that she is reactive about are: Having things that she "should do, being told that she should do something when she is already doing it, being overstimulated or understimulated and crying after sex.  We did a neural network map and we were able to see specifically where she has gone from extreme to extreme in her life starting when she was a young child.  Her parents were very different almost to the extreme.  We will utilize the neural network map to start looking at what things we could target with EMDR to help her not be quite as reactive and more balanced.  Interventions: Cognitive Behavioral Therapy, Dialectical Behavioral Therapy, Eye Movement Desensitization and Reprocessing (EMDR), and  Insight-Oriented  Diagnosis:Attention deficit hyperactivity disorder (ADHD), combined type  Adjustment disorder with mixed anxiety and depressed mood  Plan: Client Abilities/Strengths  Intelligent,  motivated, insightful  Client Treatment Preferences  Outpatient Individual therapy  Client Statement of Needs  "I need some help with my anxiety and ADD."  Treatment Level  Outpatient Individual therapy  Symptoms  Excessive and/or unrealistic worry that is difficult to control occurring more days than not for at least 6  months about a number of events or activities.: No Description Entered (Status: maintained). Has been  exposed to a traumatic event involving actual or perceived threat of death or serious injury.: No  Description Entered (Status: maintained). Hypervigilance (e.g., feeling constantly on edge,  experiencing concentration difficulties, having trouble falling or staying asleep, exhibiting a general  state of irritability).: No Description Entered (Status: maintained). Impairment in social, occupational,  or other areas of functioning.: No Description Entered (Status: maintained).  Problems Addressed  Adjustment disorder with mixed anxiety and depression  Goals 1. Eliminate or reduce the negative impact trauma related symptoms have  on social, occupational, and family functioning. Objective Participate in Eye Movement Desensitization and Reprocessing (EMDR) to reduce emotional distress  related to traumatic thoughts, feelings, and images. Target Date:  12/18/2024 Frequency: Bi Weekly  Progress: 0 Modality: individual Related Interventions 1. Utilize Eye Movement Desensitization and Reprocessing (EMDR) to reduce the client's  emotional reactivity to the traumatic event and reduce PTSD symptoms. 2. Stabilize anxiety and depression level while increasing ability to function on a daily  basis. Objective Learn and implement problem-solving strategies  for realistically  addressing worries. Target Date: 12/18/2024 Frequency: Bi Weekly Progress: 0 Modality: individual Related Interventions 1. 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

## 2024-03-13 DIAGNOSIS — Z419 Encounter for procedure for purposes other than remedying health state, unspecified: Secondary | ICD-10-CM | POA: Diagnosis not present

## 2024-03-19 ENCOUNTER — Ambulatory Visit (INDEPENDENT_AMBULATORY_CARE_PROVIDER_SITE_OTHER): Admitting: Psychology

## 2024-03-19 DIAGNOSIS — F902 Attention-deficit hyperactivity disorder, combined type: Secondary | ICD-10-CM | POA: Diagnosis not present

## 2024-03-19 DIAGNOSIS — F4323 Adjustment disorder with mixed anxiety and depressed mood: Secondary | ICD-10-CM | POA: Diagnosis not present

## 2024-03-19 NOTE — Progress Notes (Signed)
 [name]  Patient ID: Michelle Mclean, MRN: [id number],    Date: 03/19/2024  Time Spent: 56 minutes Time In:  9:04 Time out:  10:00  Treatment Type: Individual Therapy  Reported Symptoms: history of cutting, sadness,   Mental Status Exam: Appearance:  goth    Behavior: Appropriate  Motor: Normal  Speech/Language:  Normal Rate  Affect: Blunt  Mood: anxious  Thought process: normal  Thought content:   WNL  Sensory/Perceptual disturbances:   WNL  Orientation: oriented to person, place, time/date, and situation  Attention: Good  Concentration: Good  Memory: WNL  Fund of knowledge:  Good  Insight:   Good  Judgment:  Fair  Impulse Control: Fair   Risk Assessment: Danger to Self:  No Self-injurious Behavior: No Danger to Others: No Duty to Warn:no Physical Aggression / Violence:No  Access to Firearms a concern: No  Gang Involvement:No   Subjective: The patient came in for an individual therapy session in the office today.  The patient presents with a blunted affect and her mood is anxious today.  The patient reports that she went to [facility] and had a nice time.  She said that she was feeling some guilt and shame because she met someone that she was attracted to there.  She reports that she did not crossing the lines but it made her start thinking more about her significant other moving in in November and we ended up processing her feelings about that.  We talked about the need for her to really know what she is doing and if she chooses to commit to that situation.  I explained to her that it was a good thing for her to be looking at that because she certainly does not want to get herself into something that is harder to deal with in the long run.  We talked about different ways to look at the situation and to slow things down.  Interventions: Cognitive Behavioral Therapy, Dialectical Behavioral Therapy, Eye Movement  Desensitization and Reprocessing (EMDR), and Insight-Oriented  Diagnosis:Attention deficit hyperactivity disorder (ADHD), combined type  Adjustment disorder with mixed anxiety and depressed mood  Plan: Client Abilities/Strengths  Intelligent,  motivated, insightful  Client Treatment Preferences  Outpatient Individual therapy  Client Statement of Needs  I need some help with my anxiety and ADD.  Treatment Level  Outpatient Individual therapy  Symptoms  Excessive and/or unrealistic worry that is difficult to control occurring more days than not for at least 6  months about a number of events or activities.: No Description Entered (Status: maintained). Has been  exposed to a traumatic event involving actual or perceived threat of death or serious injury.: No  Description Entered (Status: maintained). Hypervigilance (e.g., feeling constantly on edge,  experiencing concentration difficulties, having trouble falling or staying asleep, exhibiting a general  state of irritability).: No Description Entered (Status: maintained). Impairment in social, occupational,  or other areas of functioning.: No Description Entered (Status: maintained).  Problems Addressed  Adjustment disorder with mixed anxiety and depression  Goals 1. Eliminate or reduce the negative impact trauma related symptoms have  on social, occupational, and family functioning. Objective Participate in Eye Movement Desensitization and Reprocessing (EMDR) to reduce emotional distress  related to traumatic thoughts, feelings, and images. Target Date:  12/18/2024 Frequency: Bi Weekly  Progress: 0 Modality: individual Related Interventions 1. Utilize Eye Movement Desensitization and Reprocessing (EMDR) to reduce the client's  emotional reactivity to the traumatic event and reduce PTSD symptoms. 2.  Stabilize anxiety and depression level while increasing ability to function on a daily  basis. Objective Learn and implement  problem-solving strategies for realistically addressing worries. Target Date: 12/18/2024 Frequency: Bi Weekly Progress: 0 Modality: individual Related Interventions 1. 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

## 2024-04-10 DIAGNOSIS — F17210 Nicotine dependence, cigarettes, uncomplicated: Secondary | ICD-10-CM | POA: Diagnosis not present

## 2024-04-10 DIAGNOSIS — R609 Edema, unspecified: Secondary | ICD-10-CM | POA: Diagnosis not present

## 2024-04-10 DIAGNOSIS — T63484A Toxic effect of venom of other arthropod, undetermined, initial encounter: Secondary | ICD-10-CM | POA: Diagnosis not present

## 2024-04-10 DIAGNOSIS — T63441A Toxic effect of venom of bees, accidental (unintentional), initial encounter: Secondary | ICD-10-CM | POA: Diagnosis not present

## 2024-04-10 DIAGNOSIS — R110 Nausea: Secondary | ICD-10-CM | POA: Diagnosis not present

## 2024-04-10 DIAGNOSIS — X58XXXA Exposure to other specified factors, initial encounter: Secondary | ICD-10-CM | POA: Diagnosis not present

## 2024-04-10 DIAGNOSIS — R239 Unspecified skin changes: Secondary | ICD-10-CM | POA: Diagnosis not present

## 2024-04-10 DIAGNOSIS — L509 Urticaria, unspecified: Secondary | ICD-10-CM | POA: Diagnosis not present

## 2024-04-10 DIAGNOSIS — T782XXA Anaphylactic shock, unspecified, initial encounter: Secondary | ICD-10-CM | POA: Diagnosis not present

## 2024-04-10 NOTE — ED Provider Notes (Signed)
 [facility]  ED Provider Note  Michelle Mclean 28 y.o. female DOB: 1995/11/27 MRN: [id number] History   Chief Complaint  Patient presents with  . Allergic Reaction    Pt BIB ems from urgent care after having two epipen injections due to bee/wasp sting. Pt had hives and facial swelling that have started improving, no throat swelling. EMS reports patient was hypotensive briefly at UC (74 systolic) and was nauseous after epi, received ODT zofran.   Michelle Mclean presents to the ED with anaphylaxis.  She was seen earlier at urgent care after being stung by a bee.  She was on her porch carrying a package inside.  The bee stung her on the left forearm.  She immediately started to have a skin rash.  She also felt lip swelling.  While at urgent care, she was hypotensive and did vomit.  She is currently feeling overall better but is still a little bit shaky.  Her lips still feel little bit tight.   History provided by:  Patient Allergic Reaction      Past Medical History:  Diagnosis Date  . GERD (gastroesophageal reflux disease)   . Ovarian cyst     Past Surgical History:  Procedure Laterality Date  . Wisdom tooth extraction      Social History   Substance and Sexual Activity  Alcohol Use Yes   Comment: socially   Tobacco Use History[1] E-Cigarettes  . Vaping Use Former Neurosurgeon   . Start Date    . Cartridges/Day    . Quit Date     Social History   Substance and Sexual Activity  Drug Use Yes  . Types: Marijuana   Comment: last use today         Allergies[2]  Home Medications   BUSPIRONE  (BUSPAR ) 5 MG TABLET    Take one tablet (5 mg dose) by mouth 2 (two) times daily.    Primary Survey  Primary Survey  Review of Systems   Review of Systems  Respiratory:  Negative for chest tightness.     Physical Exam   ED Triage Vitals [04/10/24 1442]  BP (!) 118/56  Heart Rate 73  Resp 16  SpO2 98 %  Temp 98.5 F (36.9  C)    Physical Exam  Nursing note and vitals reviewed. Constitutional: She does not appear distressed and does not appear ill.  HENT:  Head: Normocephalic and atraumatic.  Handling secretions  Eyes: Conjunctivae are normal.  Cardiovascular: Normal rate, regular rhythm and normal heart sounds.  Pulmonary/Chest: Respiratory effort normal and breath sounds normal.  Musculoskeletal: No obvious deformity noted to extremities. no edema.  Neurological: She is alert and oriented to person, place, and time. Moves all extremities equally.  Skin:  Urticarial rash on both arms and thighs     ED Course   Lab results: No data to display  Imaging: No data to display    ECG: ECG Results   None                                                                     Pre-Sedation Procedures  ED Course as of 04/10/24 1827  Michelle Mclean's Documentation  Thu Apr 10, 2024  1827 Slight  increase in urticaria on thighs.  No residual respiratory symptoms.  No GI symptoms.  I will administer 1 more dose of Benadryl, but I am comfortable with discharge home.  She is also comfortable with discharge home.  We talked through return precautions as well as the appropriate use of epinephrine if it becomes necessary.   Medical Decision Making Michelle Mclean presents to the ED after experiencing an anaphylactic reaction due to a bee sting.  She was given epinephrine x 2 secondary to being hypotensive.  She is currently improving but will be observed for 4 hours.  Problems Addressed: Anaphylaxis, initial encounter: acute illness or injury with systemic symptoms  Amount and/or Complexity of Data Reviewed External Data Reviewed: notes.    Details: Outpatient notes  Risk Prescription drug management.           Provider Communication  New Prescriptions   EPINEPHRINE (EPIPEN 2-PAK) 0.3 MG/0.3 ML INJECTION    Inject 0.3 mLs (0.3 mg dose) into the  muscle once as needed for Anaphylaxis for up to 1 dose.      Quantity: 1 each    Refills: 0    Modified Medications   No medications on file    Discontinued Medications   No medications on file    Clinical Impression Final diagnoses:  Anaphylaxis, initial encounter    ED Disposition     ED Disposition  Observation   Condition  --   Comment  --                   Electronically signed by:   Michelle KANDICE Silvan, MD 04/10/24 1827      [1] Social History Tobacco Use  Smoking Status Every Day  . Current packs/day: 0.25  . Types: Cigarettes  Smokeless Tobacco Never  [2] Allergies Allergen Reactions  . Bee Hives, Itching and Swelling

## 2024-04-11 ENCOUNTER — Ambulatory Visit: Admitting: Psychology

## 2024-04-11 ENCOUNTER — Ambulatory Visit: Payer: Self-pay | Admitting: *Deleted

## 2024-04-11 ENCOUNTER — Ambulatory Visit: Admitting: Physician Assistant

## 2024-04-11 DIAGNOSIS — S1195XA Open bite of unspecified part of neck, initial encounter: Secondary | ICD-10-CM | POA: Diagnosis not present

## 2024-04-11 DIAGNOSIS — S0195XA Open bite of unspecified part of head, initial encounter: Secondary | ICD-10-CM | POA: Diagnosis not present

## 2024-04-11 DIAGNOSIS — L509 Urticaria, unspecified: Secondary | ICD-10-CM | POA: Diagnosis not present

## 2024-04-11 DIAGNOSIS — T63484A Toxic effect of venom of other arthropod, undetermined, initial encounter: Secondary | ICD-10-CM | POA: Diagnosis not present

## 2024-04-11 DIAGNOSIS — F17210 Nicotine dependence, cigarettes, uncomplicated: Secondary | ICD-10-CM | POA: Diagnosis not present

## 2024-04-11 DIAGNOSIS — W57XXXA Bitten or stung by nonvenomous insect and other nonvenomous arthropods, initial encounter: Secondary | ICD-10-CM | POA: Diagnosis not present

## 2024-04-11 NOTE — Telephone Encounter (Signed)
 FYI Only or Action Required?: Action required by provider: request for appointment and clinical question for provider.  Patient was last seen in primary care on 12/24/23.  Called Nurse Triage reporting Urticaria.  Symptoms began yesterday.  Interventions attempted: OTC medications: benadryl.  Symptoms are: gradually worsening.  Triage Disposition: See Physician Within 24 Hours  Patient/caregiver understands and will follow disposition?: Yes                 Copied from CRM [id number]. Topic: Clinical - Red Word Triage >> Apr 11, 2024 11:41 AM Michelle Mclean wrote: Red Word that prompted transfer to Nurse Triage: Patient was at ED yesterday for anaphylaxis. Today she feels like it's getting worse medication isn't helping Reason for Disposition  [1] MODERATE-SEVERE hives (e.g.,hives interfere with normal activities or work) AND [2] not improved after taking antihistamine (e.g., cetirizine, fexofenadine, or loratadine ) > 24 hours  Answer Assessment - Initial Assessment Questions VV my chart appt scheduled today due to mulitple questions regarding medications to take for itching and hives. Recommended cool compresses, aveno oatmeal bath.        1. APPEARANCE: What does the rash look like?      Red raised hives s/p bee sting seen in ED yesterday  2. LOCATION: Where is the rash located?      All over body  3. NUMBER: How many hives are there?      Multiple  4. SIZE: How big are the hives? (e.g., inches, cm, compare to coins) Do they all look the same or do they vary in shape and size?      na 5. ONSET: When did the hives begin? (e.g., hours or days ago)      Worse today  6. ITCHING: Does it itch? If Yes, ask: How bad is the itch?  (e.g., none, mild, moderate, severe)     Itching severe 7. RECURRENT PROBLEM: Have you had hives before? If Yes, ask: When was the last time? and What happened that time?      na 8. TRIGGERS: Were you exposed to any new  food, plant, cosmetic product or animal just before the hives began?     Bee sting seen in ED yesterday  9. OTHER SYMPTOMS: Do you have any other symptoms? (e.g., fever, tongue swelling, difficulty breathing, abdomen pain)     No other sx . Today hives spreading and severe itching.  10. PREGNANCY: Is there any chance you are pregnant? When was your last menstrual period?       na  Protocols used: Hives-A-AH

## 2024-04-12 DIAGNOSIS — Z419 Encounter for procedure for purposes other than remedying health state, unspecified: Secondary | ICD-10-CM | POA: Diagnosis not present

## 2024-04-24 ENCOUNTER — Ambulatory Visit: Admitting: Psychology

## 2024-04-24 DIAGNOSIS — F902 Attention-deficit hyperactivity disorder, combined type: Secondary | ICD-10-CM

## 2024-04-24 DIAGNOSIS — F4323 Adjustment disorder with mixed anxiety and depressed mood: Secondary | ICD-10-CM
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
## 2024-05-13 ENCOUNTER — Ambulatory Visit (INDEPENDENT_AMBULATORY_CARE_PROVIDER_SITE_OTHER): Admitting: Psychology

## 2024-05-13 DIAGNOSIS — F902 Attention-deficit hyperactivity disorder, combined type: Secondary | ICD-10-CM

## 2024-05-13 DIAGNOSIS — Z419 Encounter for procedure for purposes other than remedying health state, unspecified: Secondary | ICD-10-CM | POA: Diagnosis not present

## 2024-05-13 DIAGNOSIS — F4310 Post-traumatic stress disorder, unspecified: Secondary | ICD-10-CM

## 2024-05-13 DIAGNOSIS — F4323 Adjustment disorder with mixed anxiety and depressed mood: Secondary | ICD-10-CM

## 2024-05-13 NOTE — Progress Notes (Signed)
 [facility] Counselor/Therapist Progress Note  Patient ID: CHEVETTE FEE, MRN: [id number],    Date: 05/13/2024  Time Spent: 60 minutes Time In:  11:00 Time out:  12:00  Treatment Type: Individual Therapy  Reported Symptoms: history of cutting, sadness,   Mental Status Exam: Appearance:  goth    Behavior: Appropriate  Motor: Normal  Speech/Language:  Normal Rate  Affect: Blunt  Mood: anxious  Thought process: normal  Thought content:   WNL  Sensory/Perceptual disturbances:   WNL  Orientation: oriented to person, place, time/date, and situation  Attention: Good  Concentration: Good  Memory: WNL  Fund of knowledge:  Good  Insight:   Good  Judgment:  Fair  Impulse Control: Fair   Risk Assessment: Danger to Self:  No Self-injurious Behavior: No Danger to Others: No Duty to Warn:no Physical Aggression / Violence:No  Access to Firearms a concern: No  Gang Involvement:No   Subjective: The patient came in for an individual therapy session in the office today.  The patient presents with a blunted affect and her mood is anxious.  The patient reports that she and Norleen became intermittent again and she did have a little bit of an episode of dissociating.  We talked about what causes her to dissociate and I do believe that she had an insight about understanding that older men or men in general tend to objectify her and that is part of her issue.  I have asked her to try to get herself in more of a meditative state at times so that we can move forward with the EMDR as I do believe she is wounded and needs to be able to calm herself and maintain balance while we go through the process of doing the EMDR.  I did explain to her that I would be retiring in the next 8 months and that we probably needed to work towards getting her in a better place by that time.  She states that she will try to work on doing the meditation and mindfulness between now and her next  session. Interventions: Cognitive Behavioral Therapy, Dialectical Behavioral Therapy, Eye Movement Desensitization and Reprocessing (EMDR), and Insight-Oriented  Diagnosis:Attention deficit hyperactivity disorder (ADHD), combined type  Adjustment disorder with mixed anxiety and depressed mood  PTSD (post-traumatic stress disorder)  Plan: Client Abilities/Strengths  Intelligent,  motivated, insightful  Client Treatment Preferences  Outpatient Individual therapy  Client Statement of Needs  I need some help with my anxiety and ADD.  Treatment Level  Outpatient Individual therapy  Symptoms  Excessive and/or unrealistic worry that is difficult to control occurring more days than not for at least 6  months about a number of events or activities.: No Description Entered (Status: maintained). Has been  exposed to a traumatic event involving actual or perceived threat of death or serious injury.: No  Description Entered (Status: maintained). Hypervigilance (e.g., feeling constantly on edge,  experiencing concentration difficulties, having trouble falling or staying asleep, exhibiting a general  state of irritability).: No Description Entered (Status: maintained). Impairment in social, occupational,  or other areas of functioning.: No Description Entered (Status: maintained).  Problems Addressed  Adjustment disorder with mixed anxiety and depression  Goals 1. Eliminate or reduce the negative impact trauma related symptoms have  on social, occupational, and family functioning. Objective Participate in Eye Movement Desensitization and Reprocessing (EMDR) to reduce emotional distress  related to traumatic thoughts, feelings, and images. Target Date:  12/18/2024 Frequency: Bi Weekly  Progress: 0 Modality: individual Related  Interventions 1. Utilize Eye Movement Desensitization and Reprocessing (EMDR) to reduce the client's  emotional reactivity to the traumatic event and reduce PTSD  symptoms. 2. Stabilize anxiety and depression level while increasing ability to function on a daily  basis. Objective Learn and implement problem-solving strategies for realistically addressing worries. Target Date: 12/18/2024 Frequency: Bi Weekly Progress: 0 Modality: individual Related Interventions 1. 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

## 2024-05-28 ENCOUNTER — Ambulatory Visit (INDEPENDENT_AMBULATORY_CARE_PROVIDER_SITE_OTHER): Admitting: Physician Assistant

## 2024-05-28 ENCOUNTER — Encounter: Payer: Self-pay | Admitting: Physician Assistant

## 2024-05-28 VITALS — BP 108/69 | HR 76 | Ht 72.0 in | Wt 201.6 lb

## 2024-05-28 DIAGNOSIS — L509 Urticaria, unspecified: Secondary | ICD-10-CM | POA: Diagnosis not present

## 2024-05-28 DIAGNOSIS — T782XXD Anaphylactic shock, unspecified, subsequent encounter: Secondary | ICD-10-CM | POA: Diagnosis not present

## 2024-05-28 NOTE — Progress Notes (Signed)
      Established patient visit   Patient: Michelle Mclean   DOB: 01-Mar-1996   28 y.o. Female  MRN: [id number] Visit Date: 05/28/2024  Today's healthcare provider: Manuelita Flatness, PA-C   Cc. Allergic reaction  Subjective    Pt was stung by a bee/wasp 7/10, admin an epi pen x 2 by an urgent care, was hypotensive and brought to the ED. Pt was evaluated and cleared.   Her symptoms improved initially but the next day hives/itching returned, she went back to the ED and was tx with steroids.   She reports it took several weeks for hives/itching to resolve, and she still feels minor symptoms return intermittently. As this was her first allergic reaction she would like a referral to allergy to investigate further.  Medications: Outpatient Medications Prior to Visit  Medication Sig   busPIRone  (BUSPAR ) 5 MG tablet Take 1 tablet (5 mg total) by mouth 2 (two) times daily.   No facility-administered medications prior to visit.    Review of Systems  Constitutional:  Negative for fatigue and fever.  Respiratory:  Negative for cough and shortness of breath.   Cardiovascular:  Negative for chest pain and leg swelling.  Gastrointestinal:  Negative for abdominal pain.  Skin:  Positive for rash.  Neurological:  Negative for dizziness and headaches.       Objective    BP 108/69   Pulse 76   Ht 6' (1.829 m)   Wt 201 lb 9.6 oz (91.4 kg)   LMP 05/27/2024   BMI 27.34 kg/m    Physical Exam Vitals reviewed.  Constitutional:      Appearance: She is not ill-appearing.  HENT:     Head: Normocephalic.  Eyes:     Conjunctiva/sclera: Conjunctivae normal.  Cardiovascular:     Rate and Rhythm: Normal rate.  Pulmonary:     Effort: Pulmonary effort is normal. No respiratory distress.  Neurological:     Mental Status: She is alert and oriented to person, place, and time.  Psychiatric:        Mood and Affect: Mood normal.        Behavior: Behavior normal.      No results found for any  visits on 05/28/24.  Assessment & Plan    Anaphylaxis, subsequent encounter -     Ambulatory referral to Allergy  Urticaria   - Ensure she carries Epipen at all times - Educate on the use of Epipen and the need to go to the hospital after use - Refer to allergist for further evaluation and testing for specific insect venom allergies - Recommend daily Claritin  to manage residual allergic symptoms       Return if symptoms worsen or fail to improve.       Manuelita Flatness, PA-C  [facility] [phone] (phone) [phone] (fax)  [facility]

## 2024-05-29 ENCOUNTER — Ambulatory Visit: Admitting: Psychology

## 2024-05-29 DIAGNOSIS — F902 Attention-deficit hyperactivity disorder, combined type: Secondary | ICD-10-CM

## 2024-05-29 DIAGNOSIS — F4323 Adjustment disorder with mixed anxiety and depressed mood: Secondary | ICD-10-CM | POA: Diagnosis not present
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
## 2024-06-09 ENCOUNTER — Telehealth: Admitting: Physician Assistant

## 2024-06-09 DIAGNOSIS — J069 Acute upper respiratory infection, unspecified: Secondary | ICD-10-CM | POA: Diagnosis not present

## 2024-06-09 MED ORDER — FLUTICASONE PROPIONATE 50 MCG/ACT NA SUSP
2.0000 | Freq: Every day | NASAL | 0 refills | Status: DC
Start: 2024-06-09 — End: 2024-06-19

## 2024-06-09 MED ORDER — PSEUDOEPH-BROMPHEN-DM 30-2-10 MG/5ML PO SYRP: 5.0000 mL | ORAL_SOLUTION | Freq: Four times a day (QID) | ORAL | 0 refills | Status: DC | PRN

## 2024-06-09 NOTE — Progress Notes (Signed)

## 2024-06-13 DIAGNOSIS — Z419 Encounter for procedure for purposes other than remedying health state, unspecified: Secondary | ICD-10-CM | POA: Diagnosis not present

## 2024-06-17 ENCOUNTER — Ambulatory Visit (INDEPENDENT_AMBULATORY_CARE_PROVIDER_SITE_OTHER): Admitting: Psychology

## 2024-06-17 DIAGNOSIS — F902 Attention-deficit hyperactivity disorder, combined type: Secondary | ICD-10-CM | POA: Diagnosis not present

## 2024-06-17 DIAGNOSIS — F4323 Adjustment disorder with mixed anxiety and depressed mood: Secondary | ICD-10-CM | POA: Diagnosis not present

## 2024-06-17 DIAGNOSIS — F4310 Post-traumatic stress disorder, unspecified: Secondary | ICD-10-CM | POA: Diagnosis not present

## 2024-06-17 NOTE — Progress Notes (Signed)
 [facility] Counselor/Therapist Progress Note  Patient ID: NIL BOLSER, MRN: [id number],    Date: 06/17/2024  Time Spent: 57 minutes Time In:  10:03 Time out:  11:00  Treatment Type: Individual Therapy  Reported Symptoms: history of cutting, sadness,   Mental Status Exam: Appearance:  goth    Behavior: distracted  Motor: Normal  Speech/Language:  Normal Rate  Affect: Blunt  Mood: anxious  Thought process: normal  Thought content:   WNL  Sensory/Perceptual disturbances:   WNL  Orientation: oriented to person, place, time/date, and situation  Attention: Good  Concentration: Good  Memory: WNL  Fund of knowledge:  Good  Insight:   Good  Judgment:  Fair  Impulse Control: Fair   Risk Assessment: Danger to Self:  No Self-injurious Behavior: No Danger to Others: No Duty to Warn:no Physical Aggression / Violence:No  Access to Firearms a concern: No  Gang Involvement:No   Subjective: The patient came in for an individual therapy session in the office today.  The patient presents with a blunted affect and her mood is pleasant.  The patient seems real distracted today.  She talked about giving up alcohol and other substances in her life.  She did report that she smokes something yesterday.  I encouraged her to try to get rid of anything that could be a self-medication tool.  We talked about meditating every day and I recommended that she be substance free before we start the EMDR.  She did report that she was not able to go to [place] next month and she seems sad about that but in some ways she seems relieved because she was going with someone that might have been questionable.  We will start looking out what it is we want to do EMDR on but I will continue to monitor the patient to see if she is able to utilize meditation and mindfulness to help calm herself down.  Interventions: Cognitive Behavioral Therapy, Dialectical Behavioral Therapy, Eye Movement  Desensitization and Reprocessing (EMDR), and Insight-Oriented  Diagnosis:Attention deficit hyperactivity disorder (ADHD), combined type  Adjustment disorder with mixed anxiety and depressed mood  PTSD (post-traumatic stress disorder)  Plan: Client Abilities/Strengths  Intelligent,  motivated, insightful  Client Treatment Preferences  Outpatient Individual therapy  Client Statement of Needs  I need some help with my anxiety and ADD.  Treatment Level  Outpatient Individual therapy  Symptoms  Excessive and/or unrealistic worry that is difficult to control occurring more days than not for at least 6  months about a number of events or activities.: No Description Entered (Status: maintained). Has been  exposed to a traumatic event involving actual or perceived threat of death or serious injury.: No  Description Entered (Status: maintained). Hypervigilance (e.g., feeling constantly on edge,  experiencing concentration difficulties, having trouble falling or staying asleep, exhibiting a general  state of irritability).: No Description Entered (Status: maintained). Impairment in social, occupational,  or other areas of functioning.: No Description Entered (Status: maintained).  Problems Addressed  Adjustment disorder with mixed anxiety and depression  Goals 1. Eliminate or reduce the negative impact trauma related symptoms have  on social, occupational, and family functioning. Objective Participate in Eye Movement Desensitization and Reprocessing (EMDR) to reduce emotional distress  related to traumatic thoughts, feelings, and images. Target Date:  12/18/2024 Frequency: Bi Weekly  Progress: 0 Modality: individual Related Interventions 1. Utilize Eye Movement Desensitization and Reprocessing (EMDR) to reduce the client's  emotional reactivity to the traumatic event and reduce PTSD symptoms. 2.  Stabilize anxiety and depression level while increasing ability to function on a daily   basis. Objective Learn and implement problem-solving strategies for realistically addressing worries. Target Date: 12/18/2024 Frequency: Bi Weekly Progress: 10 Modality: individual Related Interventions 1. Teach the client problem-solving strategies involving specifically defining a problem,  generating options for addressing it, evaluating the pros and cons of each option, selecting and  implementing an optional action, and reevaluating and refining the action  Diagnosis PTSD F43.23  Adjustment Disorder with mixed anxiety and depression Disorder  Conditions For Discharge Achievement of treatment goals and objectives  Jisel Fleet G Mayan Kloepfer, LCSW                                 Cline Draheim G Arlena Marsan, LCSW

## 2024-06-19 ENCOUNTER — Encounter: Payer: Self-pay | Admitting: Allergy

## 2024-06-19 ENCOUNTER — Ambulatory Visit (INDEPENDENT_AMBULATORY_CARE_PROVIDER_SITE_OTHER): Payer: Self-pay | Admitting: Allergy

## 2024-06-19 VITALS — BP 116/88 | HR 82 | Temp 98.0°F | Resp 18 | Ht 71.5 in | Wt 198.8 lb

## 2024-06-19 DIAGNOSIS — T7840XA Allergy, unspecified, initial encounter: Secondary | ICD-10-CM

## 2024-06-19 DIAGNOSIS — T7840XD Allergy, unspecified, subsequent encounter: Secondary | ICD-10-CM | POA: Diagnosis not present

## 2024-06-19 DIAGNOSIS — Z91038 Other insect allergy status: Secondary | ICD-10-CM | POA: Diagnosis not present

## 2024-06-19 DIAGNOSIS — L509 Urticaria, unspecified: Secondary | ICD-10-CM

## 2024-06-19 DIAGNOSIS — J3089 Other allergic rhinitis: Secondary | ICD-10-CM | POA: Diagnosis not present

## 2024-06-19 MED ORDER — CETIRIZINE HCL 10 MG PO TABS: 10.0000 mg | ORAL_TABLET | Freq: Two times a day (BID) | ORAL | 5 refills | Status: DC

## 2024-06-19 MED ORDER — FAMOTIDINE 20 MG PO TABS: 20.0000 mg | ORAL_TABLET | Freq: Two times a day (BID) | ORAL | 5 refills | Status: DC

## 2024-06-19 NOTE — Progress Notes (Signed)
 New Patient Note  RE: Michelle Mclean MRN: [id number] DOB: Sep 26, 1996 Date of Office Visit: 06/19/2024  Consult requested by: Cyndi Shaver, PA-C Primary care provider: Cyndi Shaver, PA-C (Inactive)  Chief Complaint: Establish Care (Skinging insect reaction saw willow paper wasp on porch 1 week prior to incident seen at [facility] given 2 epi and sent to ED lips felt as if they were going to fly off unsure if it was a wasp, bee or ant)  History of Present Illness: I had the pleasure of seeing Michelle Mclean for initial evaluation at the [facility] on 06/19/2024. She is a 28 y.o. female, who is referred here by Cyndi Shaver, PA-C for the evaluation of hymenoptera allergy.  Discussed the use of AI scribe software for clinical note transcription with the patient, who gave verbal consent to proceed.    In July, she experienced a severe allergic reaction after being stung by an unidentified insect while on her porch. The sensation was described as 'hot lava', with itching initially on her scalp, followed by hives spreading to her bikini line, back, and mouth within ten minutes. She took Benadryl , but symptoms worsened, prompting her to drive to urgent care where she received two doses of epinephrine due to a significant drop in blood pressure (74/40 mmHg). She was then transported to the ER and received Benadryl  and IV fluids.  The following day, she returned to the ER due to recurring hives and was prescribed steroids. Despite treatment, she experienced persistent hives on her hands daily for several weeks, along with increased allergy symptoms such as congestion and postnasal drip. She has been using Claritin  intermittently, but reports it is not fully effective.  She has a history of seasonal allergies, particularly in spring, and has experienced a severe sinus infection in the past triggered by environmental allergens. She has not been allergy tested before and has no  known food or medication allergies. She has recently quit smoking and lives in a house with four cats, soon to be seven, as her partner is moving in with three additional cats.  Her family history includes a half-sister with asthma and a grandmother with a chronic respiratory condition. 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 She has been prescribed Buspar  for PMDD, taken as needed.  No fever, chills, or recent illness. Reports persistent postnasal drip and mild hives on hands. No history of asthma or eczema.      05/28/2024 PCP visit: Pt was stung by a bee/wasp 7/10, admin an epi pen x 2 by an urgent care, was hypotensive and brought to the ED. Pt was evaluated and cleared.    Her symptoms improved initially but the next day hives/itching returned, she went back to the ED and was tx with steroids.    She reports it took several weeks for hives/itching to resolve, and she still feels minor symptoms return intermittently. As this was her first allergic reaction she would like a referral to allergy to investigate further.  04/10/2024 ER visit: Allergic Reaction  Pt BIB ems from urgent care after having two epipen injections due to bee/wasp sting. Pt had hives and facial swelling that have started improving, no throat swelling. EMS reports patient was hypotensive briefly at UC (74 systolic) and was nauseous after epi, received ODT zofran .   Michelle Mclean presents to the ED with anaphylaxis. She was seen earlier at urgent care after being stung by a bee. She was on  her porch carrying a package inside. The bee stung her on the left forearm. She immediately started to have a skin rash. She also felt lip swelling. While at urgent care, she was hypotensive and did vomit. She is currently feeling overall better but is still a little bit shaky. Her lips still feel little bit tight.  04/11/2024 ER visit: 28 year old female coming to the emergency department with  continual hives and redness from an insect bite yesterday. Patient states that she was stung by mysterious black insect with white stripes. States that shortly after she began developing hives and redness and shortness of breath states that she was given 2 shots of epinephrine while at urgent care before being transferred to the emergency department. Patient states that she was given a dose of IV steroids while here in the department along with Benadryl  states that she had significant proved in symptoms states that she was instructed to go home and continue taking Benadryl  states that this morning the redness and swelling to her left arm has returned states that she is taken 2 doses of Benadryl  with no improvement to the swelling or itching. Denies any shortness of breath or swelling to the face or throat.  Assessment and Plan: Michelle Mclean is a 28 y.o. female with: Hymenoptera allergy Allergic reaction, initial encounter Severe anaphylactic reaction in July with unknown insect type requiring IM epi x 1.  High risk due to outdoor activities. No prior reactions. Continue to avoid. See handout. For mild symptoms you can take over the counter antihistamines (zyrtec  10mg  to 20mg ) and monitor symptoms closely.  If symptoms worsen or if you have severe symptoms including breathing issues, throat closure, significant swelling, whole body hives, severe diarrhea and vomiting, lightheadedness then use epinephrine and seek immediate medical care afterwards. Emergency action plan given. Get bloodwork If positive will recommend to start allergy injections. If negative will need to do skin testing at our [facility] office. This is done once per month.   Urticaria Persistent hives on hands, possibly related to delayed venom reaction or other factors. Patient is a massage therapist.  Start zyrtec  (cetirizine ) 10mg  twice a day. If symptoms are not controlled or causes drowsiness let us  know. Start Pepcid  (famotidine )  20mg  twice a day.  Avoid the following potential triggers: alcohol, tight clothing, NSAIDs, hot showers and getting overheated. See below for proper skin care.  Get bloodwork.  Other allergic rhinitis Increased symptoms since recent allergic reaction.  Clinical history suspicious for tree pollen allergy.  See below for environmental control measures. Get bloodwork for this as well. The zyrtec  as above should help with these symptoms.  Return in about 1 year (around 06/19/2025).  Meds ordered this encounter  Medications   famotidine  (PEPCID ) 20 MG tablet    Sig: Take 1 tablet (20 mg total) by mouth 2 (two) times daily.    Dispense:  60 tablet    Refill:  5   cetirizine  (ZYRTEC  ALLERGY) 10 MG tablet    Sig: Take 1 tablet (10 mg total) by mouth 2 (two) times daily.    Dispense:  60 tablet    Refill:  5   Lab Orders         Allergen Fire Ant         Hymenoptera Venom Allergy II         Allergens w/Total IgE Area 2         Chronic Urticaria (CU) Eval         Comprehensive  metabolic panel with GFR         Alpha-Gal Panel         ANA, IFA (with reflex)      Other allergy screening: Asthma: no Rhino conjunctivitis: yes In the spring and getting worse. Otc antihistamines prn. No prior allergy testing.  Food allergy: no Medication allergy: no Eczema:no History of recurrent infections suggestive of immunodeficency: no  Diagnostics: None.    Past Medical History: Patient Active Problem List   Diagnosis Date Noted   Current every day vaping 12/24/2023   Alcohol use 11/12/2023   Tobacco use 11/12/2023   Anxiety 05/01/2023   Attention deficit 05/01/2023   Jaw pain 05/01/2023   History reviewed. No pertinent past medical history. Past Surgical History: History reviewed. No pertinent surgical history. Medication List:  Current Outpatient Medications  Medication Sig Dispense Refill   cetirizine  (ZYRTEC  ALLERGY) 10 MG tablet Take 1 tablet (10 mg total) by mouth 2 (two)  times daily. 60 tablet 5   famotidine  (PEPCID ) 20 MG tablet Take 1 tablet (20 mg total) by mouth 2 (two) times daily. 60 tablet 5   busPIRone  (BUSPAR ) 5 MG tablet Take 1 tablet (5 mg total) by mouth 2 (two) times daily. 60 tablet 0   No current facility-administered medications for this visit.   Allergies: Allergies  Allergen Reactions   Bee Venom Hives, Itching and Swelling   Social History: Social History   Socioeconomic History   Marital status: Divorced    Spouse name: Not on file   Number of children: Not on file   Years of education: Not on file   Highest education level: Some college, no degree  Occupational History   Not on file  Tobacco Use   Smoking status: Former    Current packs/day: 0.00    Types: Cigarettes    Quit date: 07/09/2015    Years since quitting: 8.9   Smokeless tobacco: Never  Substance and Sexual Activity   Alcohol use: No   Drug use: No   Sexual activity: Yes    Birth control/protection: None    Comment: female partner w/ vasectomy  Other Topics Concern   Not on file  Social History Narrative   Not on file   Social Drivers of Health   Financial Resource Strain: Low Risk  (05/27/2024)   Overall Financial Resource Strain (CARDIA)    Difficulty of Paying Living Expenses: Not hard at all  Food Insecurity: No Food Insecurity (05/27/2024)   Hunger Vital Sign    Worried About Running Out of Food in the Last Year: Never true    Ran Out of Food in the Last Year: Never true  Transportation Needs: No Transportation Needs (05/27/2024)   PRAPARE - Administrator, Civil Service (Medical): No    Lack of Transportation (Non-Medical): No  Physical Activity: Unknown (05/27/2024)   Exercise Vital Sign    Days of Exercise per Week: 2 days    Minutes of Exercise per Session: Patient declined  Stress: Patient Declined (05/27/2024)   [employer] of Occupational Health - Occupational Stress Questionnaire    Feeling of Stress: Patient declined   Social Connections: Unknown (05/27/2024)   Social Connection and Isolation Panel    Frequency of Communication with Friends and Family: More than three times a week    Frequency of Social Gatherings with Friends and Family: More than three times a week    Attends Religious Services: Patient declined    Active Member of [employer]  or Organizations: Yes    Attends Banker Meetings: More than 4 times per year    Marital Status: Patient declined   Lives in a house. Smoking: quit 50 days ago Occupation: massage therapist  Environmental History: Water Damage/mildew in the house: yes Carpet in the family room: no Carpet in the bedroom: no Heating: electric Cooling: central Pet: yes 4 cats  Family History: Family History  Problem Relation Age of Onset   ADD / ADHD Father    Problem                               Relation Asthma                                   Half-sister Eczema                                no Food allergy                          no Allergic rhino conjunctivitis     no  Review of Systems  Constitutional:  Negative for appetite change, chills, fever and unexpected weight change.  HENT:  Negative for congestion and rhinorrhea.   Eyes:  Negative for itching.  Respiratory:  Negative for cough, chest tightness, shortness of breath and wheezing.   Cardiovascular:  Negative for chest pain.  Gastrointestinal:  Negative for abdominal pain.  Genitourinary:  Negative for difficulty urinating.  Skin:  Positive for rash.  Neurological:  Negative for headaches.    Objective: BP 116/88   Pulse 82   Temp 98 F (36.7 C) (Temporal)   Resp 18   Ht 5' 11.5 (1.816 m)   Wt 198 lb 12.8 oz (90.2 kg)   LMP 05/27/2024   SpO2 100%   BMI 27.34 kg/m  Body mass index is 27.34 kg/m. Physical Exam Vitals and nursing note reviewed.  Constitutional:      Appearance: Normal appearance. She is well-developed.  HENT:     Head: Normocephalic and atraumatic.     Right  Ear: Tympanic membrane and external ear normal.     Left Ear: Tympanic membrane and external ear normal.     Nose: Nose normal.     Mouth/Throat:     Mouth: Mucous membranes are moist.     Pharynx: Oropharynx is clear.  Eyes:     Conjunctiva/sclera: Conjunctivae normal.  Cardiovascular:     Rate and Rhythm: Normal rate and regular rhythm.     Heart sounds: Normal heart sounds. No murmur heard.    No friction rub. No gallop.  Pulmonary:     Effort: Pulmonary effort is normal.     Breath sounds: Normal breath sounds. No wheezing, rhonchi or rales.  Musculoskeletal:     Cervical back: Neck supple.  Skin:    General: Skin is warm.     Findings: No rash.  Neurological:     Mental Status: She is alert and oriented to person, place, and time.  Psychiatric:        Behavior: Behavior normal.    The plan was reviewed with the patient/family, and all questions/concerned were addressed.  It was my pleasure to see Michelle Mclean today and participate in her care. Please feel free  to contact me with any questions or concerns.  Sincerely,  Orlan Cramp, DO Allergy & Immunology  [facility]  [facility] office: [phone] [facility] office: [phone]

## 2024-06-19 NOTE — Patient Instructions (Addendum)
 Insect sting Continue to avoid. See handout. For mild symptoms you can take over the counter antihistamines (zyrtec  10mg  to 20mg ) and monitor symptoms closely.  If symptoms worsen or if you have severe symptoms including breathing issues, throat closure, significant swelling, whole body hives, severe diarrhea and vomiting, lightheadedness then use epinephrine and seek immediate medical care afterwards. Emergency action plan given.  Get bloodwork If positive will recommend to start allergy injections. If negative will need to do skin testing at our [facility] office. This is done once per month.  We are ordering labs, so please allow 1-2 weeks for the results to come back. With the newly implemented Cures Act, the labs might be visible to you at the same time that they become visible to me. However, I will not address the results until all of the results are back, so please be patient.  In the meantime, continue recommendations in your patient instructions, including avoidance measures (if applicable), until you hear from me.  Hives: Start zyrtec  (cetirizine ) 10mg  twice a day. If symptoms are not controlled or causes drowsiness let us  know. Start Pepcid  (famotidine ) 20mg  twice a day.  Avoid the following potential triggers: alcohol, tight clothing, NSAIDs, hot showers and getting overheated. See below for proper skin care.  Get bloodwork.  Environmental allergies Clinical history suspicious for tree pollen allergy.  See below for environmental control measures. Get bloodwork for this as well. The zyrtec  as above should help with these symptoms.  Follow up in 1 year or sooner if needed.   Reducing Pollen Exposure Pollen seasons: trees (spring), grass (summer) and ragweed/weeds (fall). Keep windows closed in your home and car to lower pollen exposure.  Install air conditioning in the bedroom and throughout the house if possible.  Avoid going out in dry windy days - especially early  morning. Pollen counts are highest between 5 - 10 AM and on dry, hot and windy days.  Save outside activities for late afternoon or after a heavy rain, when pollen levels are lower.  Avoid mowing of grass if you have grass pollen allergy. Be aware that pollen can also be transported indoors on people and pets.  Dry your clothes in an automatic dryer rather than hanging them outside where they might collect pollen.  Rinse hair and eyes before bedtime.  Skin care recommendations  Bath time: Always use lukewarm water. AVOID very hot or cold water. Keep bathing time to 5-10 minutes. Do NOT use bubble bath. Use a mild soap and use just enough to wash the dirty areas. Do NOT scrub skin vigorously.  After bathing, pat dry your skin with a towel. Do NOT rub or scrub the skin.  Moisturizers and prescriptions:  ALWAYS apply moisturizers immediately after bathing (within 3 minutes). This helps to lock-in moisture. Use the moisturizer several times a day over the whole body. Good summer moisturizers include: Aveeno, CeraVe, Cetaphil. Good winter moisturizers include: Aquaphor, Vaseline, Cerave, Cetaphil, Eucerin, Vanicream. When using moisturizers along with medications, the moisturizer should be applied about one hour after applying the medication to prevent diluting effect of the medication or moisturize around where you applied the medications. When not using medications, the moisturizer can be continued twice daily as maintenance.  Laundry and clothing: Avoid laundry products with added color or perfumes. Use unscented hypo-allergenic laundry products such as Tide free, Cheer free & gentle, and All free and clear.  If the skin still seems dry or sensitive, you can try double-rinsing the clothes. Avoid tight  or scratchy clothing such as wool. Do not use fabric softeners or dyer sheets.

## 2024-06-24 ENCOUNTER — Telehealth: Payer: Self-pay | Admitting: Allergy

## 2024-06-24 NOTE — Telephone Encounter (Signed)
 Manju called in and states she is taking famotidine  and cetirizine  once in the morning and once in the evening.  Wilhemina states the second day of taking both medications twice a day, she has noticed she is getting stomach pains after eating, increased indigestion, nausea, and irritability with aggression.  Clarene states she is not sure what's going on or which medication is causing these symptoms.  Nareh would like to know what to do.

## 2024-06-24 NOTE — Telephone Encounter (Signed)
 Please call patient back and tell her to stop famotidine  and only take zyrtec  10mg  once a day for now.   Have her make a follow up appointment.  Are the hives better?

## 2024-06-25 NOTE — Telephone Encounter (Signed)
 I called and it went straight to voicemail and so I left voicemail to give a call back.

## 2024-06-26 ENCOUNTER — Ambulatory Visit (INDEPENDENT_AMBULATORY_CARE_PROVIDER_SITE_OTHER): Admitting: Psychology

## 2024-06-26 DIAGNOSIS — F4310 Post-traumatic stress disorder, unspecified: Secondary | ICD-10-CM

## 2024-06-26 DIAGNOSIS — F4323 Adjustment disorder with mixed anxiety and depressed mood: Secondary | ICD-10-CM

## 2024-06-26 DIAGNOSIS — F902 Attention-deficit hyperactivity disorder, combined type: Secondary | ICD-10-CM

## 2024-06-26 NOTE — Telephone Encounter (Signed)
 I called and it went straight to voicemail and so I left voicemail to give a call back.

## 2024-06-26 NOTE — Progress Notes (Signed)
 [facility] Counselor/Therapist Progress Note  Patient ID: Michelle Mclean, MRN: [id number],    Date: 06/26/2024  Time Spent: 57 minutes Time In:  10:03 Time out:  11:00  Treatment Type: Individual Therapy  Reported Symptoms: history of cutting, sadness,   Mental Status Exam: Appearance:  goth    Behavior: distracted  Motor: Normal  Speech/Language:  Normal Rate  Affect: Blunt  Mood: anxious  Thought process: normal  Thought content:   WNL  Sensory/Perceptual disturbances:   WNL  Orientation: oriented to person, place, time/date, and situation  Attention: Good  Concentration: Good  Memory: WNL  Fund of knowledge:  Good  Insight:   Good  Judgment:  Fair  Impulse Control: Fair   Risk Assessment: Danger to Self:  No Self-injurious Behavior: No Danger to Others: No Duty to Warn:no Physical Aggression / Violence:No  Access to Firearms a concern: No  Gang Involvement:No   Subjective: The patient came in for an individual therapy session in the office today.  The patient presents with a blunted affect and her mood is pleasant.  The patient seems somewhat anxious today.  She reports that she is still struggling with feeling restless.  We talked about it today and I think maybe some of what is happening is she is having withdrawal symptoms from smoking weed and talking cigarettes and drinking alcohol as she been trying to stop those things because it was health medicating.  We started talking today about the first thing were going to address with the EMDR but I do want her to continue to work toward being substance free and meditating. Interventions: Cognitive Behavioral Therapy, Dialectical Behavioral Therapy, Eye Movement Desensitization and Reprocessing (EMDR), and Insight-Oriented  Diagnosis:Attention deficit hyperactivity disorder (ADHD), combined type  Adjustment disorder with mixed anxiety and depressed mood  PTSD (post-traumatic stress disorder)  Plan:  Client Abilities/Strengths  Intelligent,  motivated, insightful  Client Treatment Preferences  Outpatient Individual therapy  Client Statement of Needs  I need some help with my anxiety and ADD.  Treatment Level  Outpatient Individual therapy  Symptoms  Excessive and/or unrealistic worry that is difficult to control occurring more days than not for at least 6  months about a number of events or activities.: No Description Entered (Status: maintained). Has been  exposed to a traumatic event involving actual or perceived threat of death or serious injury.: No  Description Entered (Status: maintained). Hypervigilance (e.g., feeling constantly on edge,  experiencing concentration difficulties, having trouble falling or staying asleep, exhibiting a general  state of irritability).: No Description Entered (Status: maintained). Impairment in social, occupational,  or other areas of functioning.: No Description Entered (Status: maintained).  Problems Addressed  Adjustment disorder with mixed anxiety and depression  Goals 1. Eliminate or reduce the negative impact trauma related symptoms have  on social, occupational, and family functioning. Objective Participate in Eye Movement Desensitization and Reprocessing (EMDR) to reduce emotional distress  related to traumatic thoughts, feelings, and images. Target Date:  12/18/2024 Frequency: Bi Weekly  Progress: 0 Modality: individual Related Interventions 1. Utilize Eye Movement Desensitization and Reprocessing (EMDR) to reduce the client's  emotional reactivity to the traumatic event and reduce PTSD symptoms. 2. Stabilize anxiety and depression level while increasing ability to function on a daily  basis. Objective Learn and implement problem-solving strategies for realistically addressing worries. Target Date: 12/18/2024 Frequency: Bi Weekly Progress: 10 Modality: individual Related Interventions 1. 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

## 2024-07-02 ENCOUNTER — Ambulatory Visit: Admitting: Psychology

## 2024-07-02 DIAGNOSIS — F4323 Adjustment disorder with mixed anxiety and depressed mood: Secondary | ICD-10-CM | POA: Diagnosis not present

## 2024-07-02 DIAGNOSIS — F902 Attention-deficit hyperactivity disorder, combined type: Secondary | ICD-10-CM | POA: Diagnosis not present

## 2024-07-02 DIAGNOSIS — F4310 Post-traumatic stress disorder, unspecified: Secondary | ICD-10-CM | POA: Diagnosis not present

## 2024-07-03 NOTE — Progress Notes (Signed)
 [facility] Counselor/Therapist Progress Note  Patient ID: Michelle Mclean, MRN: [id number],    Date: 07/02/2024  Time Spent: 55 minutes Time In:  10:05 Time out:  11:00  Treatment Type: Individual Therapy  Reported Symptoms: history of cutting, sadness,   Mental Status Exam: Appearance:  goth    Behavior: distracted  Motor: Normal  Speech/Language:  Normal Rate  Affect: Blunt  Mood: anxious  Thought process: normal  Thought content:   WNL  Sensory/Perceptual disturbances:   WNL  Orientation: oriented to person, place, time/date, and situation  Attention: Good  Concentration: Good  Memory: WNL  Fund of knowledge:  Good  Insight:   Good  Judgment:  Fair  Impulse Control: Fair   Risk Assessment: Danger to Self:  No Self-injurious Behavior: No Danger to Others: No Duty to Warn:no Physical Aggression / Violence:No  Access to Firearms a concern: No  Gang Involvement:No   Subjective: The patient came in for an individual therapy session in the office today.  The patient presents with a blunted affect and her mood is pleasant.  The patient continues to present as a little bit anxious.  She did report that she thought a great deal about what we had talked about the last time and she was able to identify a situation when she was 28 years old that we likely need to target with EMDR.  We did do some work around the negative cognition and the positive cognition that we want to use during the next session.  I still want her to continue to work with herself on getting herself in a more calm space by doing meditation and mindfulness and not using substances to avoid.  She does report that she has stopped the substances.  Interventions: Cognitive Behavioral Therapy, Dialectical Behavioral Therapy, Eye Movement Desensitization and Reprocessing (EMDR), and Insight-Oriented  Diagnosis:Attention deficit hyperactivity disorder (ADHD), combined type  Adjustment disorder with  mixed anxiety and depressed mood  PTSD (post-traumatic stress disorder)  Plan: Client Abilities/Strengths  Intelligent,  motivated, insightful  Client Treatment Preferences  Outpatient Individual therapy  Client Statement of Needs  I need some help with my anxiety and ADD.  Treatment Level  Outpatient Individual therapy  Symptoms  Excessive and/or unrealistic worry that is difficult to control occurring more days than not for at least 6  months about a number of events or activities.: No Description Entered (Status: maintained). Has been  exposed to a traumatic event involving actual or perceived threat of death or serious injury.: No  Description Entered (Status: maintained). Hypervigilance (e.g., feeling constantly on edge,  experiencing concentration difficulties, having trouble falling or staying asleep, exhibiting a general  state of irritability).: No Description Entered (Status: maintained). Impairment in social, occupational,  or other areas of functioning.: No Description Entered (Status: maintained).  Problems Addressed  Adjustment disorder with mixed anxiety and depression  Goals 1. Eliminate or reduce the negative impact trauma related symptoms have  on social, occupational, and family functioning. Objective Participate in Eye Movement Desensitization and Reprocessing (EMDR) to reduce emotional distress  related to traumatic thoughts, feelings, and images. Target Date:  12/18/2024 Frequency: Bi Weekly  Progress: 0 Modality: individual Related Interventions 1. Utilize Eye Movement Desensitization and Reprocessing (EMDR) to reduce the client's  emotional reactivity to the traumatic event and reduce PTSD symptoms. 2. Stabilize anxiety and depression level while increasing ability to function on a daily  basis. Objective Learn and implement problem-solving strategies for realistically addressing worries. Target Date: 12/18/2024 Frequency:  Bi Weekly Progress: 20  Modality: individual Related Interventions 1. 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

## 2024-07-08 ENCOUNTER — Ambulatory Visit (INDEPENDENT_AMBULATORY_CARE_PROVIDER_SITE_OTHER): Admitting: Psychology

## 2024-07-08 DIAGNOSIS — F4323 Adjustment disorder with mixed anxiety and depressed mood: Secondary | ICD-10-CM | POA: Diagnosis not present

## 2024-07-08 DIAGNOSIS — F4310 Post-traumatic stress disorder, unspecified: Secondary | ICD-10-CM

## 2024-07-08 DIAGNOSIS — F902 Attention-deficit hyperactivity disorder, combined type: Secondary | ICD-10-CM | POA: Diagnosis not present

## 2024-07-09 NOTE — Progress Notes (Signed)
 [facility] Counselor/Therapist Progress Note  Patient ID: PARA COSSEY, MRN: [id number],    Date: 07/08/2024  Time Spent: 55 minutes Time In:  2:05 Time out:  3:00  Treatment Type: Individual Therapy  Reported Symptoms: history of cutting, sadness,   Mental Status Exam: Appearance:  goth    Behavior: distracted  Motor: Normal  Speech/Language:  Normal Rate  Affect: Blunt  Mood: pleasant  Thought process: normal  Thought content:   WNL  Sensory/Perceptual disturbances:   WNL  Orientation: oriented to person, place, time/date, and situation  Attention: Good  Concentration: Good  Memory: WNL  Fund of knowledge:  Good  Insight:   Good  Judgment:  Fair  Impulse Control: Fair   Risk Assessment: Danger to Self:  No Self-injurious Behavior: No Danger to Others: No Duty to Warn:no Physical Aggression / Violence:No  Access to Firearms a concern: No  Gang Involvement:No   Subjective: The patient came in for an individual therapy session in the office today.  The patient presents with a blunted affect and her mood is pleasant.  The patient presents as pleasant and cooperative.  The patient reports that she was a little late getting here because she was staining a door.  We talked about feelings and she has said to herself that she must not be feeling things correctly and we talked about the need for her to understand that feelings do not have to be big every time you have them.  We also talked about them being very helpful and helping navigate the world and we talked about what each feeling that are significant tells us  that we need to do about something.  The patient seems to be doing much better and we will do EMDR moving forward but she seems to be processing better even without the EMDR right now. Interventions: Cognitive Behavioral Therapy, Dialectical Behavioral Therapy, Eye Movement Desensitization and Reprocessing (EMDR), and  Insight-Oriented  Diagnosis:Attention deficit hyperactivity disorder (ADHD), combined type  Adjustment disorder with mixed anxiety and depressed mood  PTSD (post-traumatic stress disorder)  Plan: Client Abilities/Strengths  Intelligent,  motivated, insightful  Client Treatment Preferences  Outpatient Individual therapy  Client Statement of Needs  I need some help with my anxiety and ADD.  Treatment Level  Outpatient Individual therapy  Symptoms  Excessive and/or unrealistic worry that is difficult to control occurring more days than not for at least 6  months about a number of events or activities.: No Description Entered (Status: maintained). Has been  exposed to a traumatic event involving actual or perceived threat of death or serious injury.: No  Description Entered (Status: maintained). Hypervigilance (e.g., feeling constantly on edge,  experiencing concentration difficulties, having trouble falling or staying asleep, exhibiting a general  state of irritability).: No Description Entered (Status: maintained). Impairment in social, occupational,  or other areas of functioning.: No Description Entered (Status: maintained).  Problems Addressed  Adjustment disorder with mixed anxiety and depression  Goals 1. Eliminate or reduce the negative impact trauma related symptoms have  on social, occupational, and family functioning. Objective Participate in Eye Movement Desensitization and Reprocessing (EMDR) to reduce emotional distress  related to traumatic thoughts, feelings, and images. Target Date:  12/18/2024 Frequency: Bi Weekly  Progress: 0 Modality: individual Related Interventions 1. Utilize Eye Movement Desensitization and Reprocessing (EMDR) to reduce the client's  emotional reactivity to the traumatic event and reduce PTSD symptoms. 2. Stabilize anxiety and depression level while increasing ability to function on a daily  basis. Objective Learn and implement  problem-solving strategies for realistically addressing worries. Target Date: 12/18/2024 Frequency: Bi Weekly Progress: 20 Modality: individual Related Interventions 1. 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

## 2024-07-13 DIAGNOSIS — Z419 Encounter for procedure for purposes other than remedying health state, unspecified: Secondary | ICD-10-CM | POA: Diagnosis not present

## 2024-07-16 ENCOUNTER — Ambulatory Visit: Admitting: Psychology

## 2024-07-16 DIAGNOSIS — F4310 Post-traumatic stress disorder, unspecified: Secondary | ICD-10-CM | POA: Diagnosis not present

## 2024-07-16 DIAGNOSIS — F4323 Adjustment disorder with mixed anxiety and depressed mood: Secondary | ICD-10-CM

## 2024-07-16 DIAGNOSIS — F902 Attention-deficit hyperactivity disorder, combined type: Secondary | ICD-10-CM

## 2024-07-17 NOTE — Progress Notes (Signed)
 [facility] Counselor/Therapist Progress Note  Patient ID: Michelle Mclean, MRN: [id number],    Date: 07/16/2024  Time Spent: 60 minutes Time In:  10:00 Time out:  11:00  Treatment Type: Individual Therapy  Reported Symptoms: history of cutting, sadness,   Mental Status Exam: Appearance:  goth    Behavior: distracted  Motor: Normal  Speech/Language:  Normal Rate  Affect: Blunt  Mood: pleasant  Thought process: normal  Thought content:   WNL  Sensory/Perceptual disturbances:   WNL  Orientation: oriented to person, place, time/date, and situation  Attention: Good  Concentration: Good  Memory: WNL  Fund of knowledge:  Good  Insight:   Good  Judgment:  Fair  Impulse Control: Fair   Risk Assessment: Danger to Self:  No Self-injurious Behavior: No Danger to Others: No Duty to Warn:no Physical Aggression / Violence:No  Access to Firearms a concern: No  Gang Involvement:No   Subjective: The patient came in for an individual therapy session in the office today.  The patient presents with a blunted affect and her mood is pleasant.  The patient presents as pleasant and cooperative.  The patient reports that she is leaving tomorrow to go to [place]  for a weeks vacation and they are hiking in [facility].  We talked about EMDR and we did do some processing and we began doing the process of EMDR.  We did the safe place exercise and the container exercise today.  The patient understood that we are working toward decreasing her reactivity and we did figure out what target we are going to use for the first time that we do EMDR when she returns from vacation.  Interventions: Cognitive Behavioral Therapy, Dialectical Behavioral Therapy, Eye Movement Desensitization and Reprocessing (EMDR), and Insight-Oriented  Diagnosis:Attention deficit hyperactivity disorder (ADHD), combined type  Adjustment disorder with mixed anxiety and depressed mood  PTSD (post-traumatic  stress disorder)  Plan: Client Abilities/Strengths  Intelligent,  motivated, insightful  Client Treatment Preferences  Outpatient Individual therapy  Client Statement of Needs  I need some help with my anxiety and ADD.  Treatment Level  Outpatient Individual therapy  Symptoms  Excessive and/or unrealistic worry that is difficult to control occurring more days than not for at least 6  months about a number of events or activities.: No Description Entered (Status: maintained). Has been  exposed to a traumatic event involving actual or perceived threat of death or serious injury.: No  Description Entered (Status: maintained). Hypervigilance (e.g., feeling constantly on edge,  experiencing concentration difficulties, having trouble falling or staying asleep, exhibiting a general  state of irritability).: No Description Entered (Status: maintained). Impairment in social, occupational,  or other areas of functioning.: No Description Entered (Status: maintained).  Problems Addressed  Adjustment disorder with mixed anxiety and depression  Goals 1. Eliminate or reduce the negative impact trauma related symptoms have  on social, occupational, and family functioning. Objective Participate in Eye Movement Desensitization and Reprocessing (EMDR) to reduce emotional distress  related to traumatic thoughts, feelings, and images. Target Date:  12/18/2024 Frequency: Bi Weekly  Progress: 10 Modality: individual Related Interventions 1. Utilize Eye Movement Desensitization and Reprocessing (EMDR) to reduce the client's  emotional reactivity to the traumatic event and reduce PTSD symptoms. 2. Stabilize anxiety and depression level while increasing ability to function on a daily  basis. Objective Learn and implement problem-solving strategies for realistically addressing worries. Target Date: 12/18/2024 Frequency: Bi Weekly Progress: 30 Modality: individual Related Interventions 1. 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

## 2024-07-24 ENCOUNTER — Ambulatory Visit (INDEPENDENT_AMBULATORY_CARE_PROVIDER_SITE_OTHER): Admitting: Psychology

## 2024-07-24 DIAGNOSIS — F4310 Post-traumatic stress disorder, unspecified: Secondary | ICD-10-CM

## 2024-07-24 DIAGNOSIS — F902 Attention-deficit hyperactivity disorder, combined type: Secondary | ICD-10-CM

## 2024-07-24 DIAGNOSIS — F4323 Adjustment disorder with mixed anxiety and depressed mood: Secondary | ICD-10-CM

## 2024-07-24 NOTE — Progress Notes (Signed)
 [facility] Counselor/Therapist Progress Note  Patient ID: ELLENOR WISNIEWSKI, MRN: [id number],    Date: 07/24/2024  Time Spent: 60 minutes Time In:  10:00 Time out:  11:00  Treatment Type: Individual Therapy  Reported Symptoms: history of cutting, sadness,   Mental Status Exam: Appearance:  goth    Behavior: distracted  Motor: Normal  Speech/Language:  Normal Rate  Affect: Blunt  Mood: pleasant  Thought process: normal  Thought content:   WNL  Sensory/Perceptual disturbances:   WNL  Orientation: oriented to person, place, time/date, and situation  Attention: Good  Concentration: Good  Memory: WNL  Fund of knowledge:  Good  Insight:   Good  Judgment:  Fair  Impulse Control: Fair   Risk Assessment: Danger to Self:  No Self-injurious Behavior: No Danger to Others: No Duty to Warn:no Physical Aggression / Violence:No  Access to Firearms a concern: No  Gang Involvement:No   Subjective: The patient came in for an individual therapy session in the office today.  The patient presents with a blunted affect and her mood is pleasant.  The patient presents as pleasant and cooperative.  Today we focused on EMDR.  We did EMDR around a situation that she had when she was 28 years old where her parents basically separated and divorced.  She remembers her mother coming in and saying that your daddy does not love us  anymore and feeling very afraid.  So we focused on the negative cognition of I am unsafe.  And we installed, I can safely express my feelings and emotions.  The patient had a suds score of 10 when we started and she did report going down to a 0 by the time we finished today.  We will proceed further with the other things that we are planning to do the EMDR on the next time I see her.  Interventions: Cognitive Behavioral Therapy, Dialectical Behavioral Therapy, Eye Movement Desensitization and Reprocessing (EMDR), and Insight-Oriented  Diagnosis:Attention deficit  hyperactivity disorder (ADHD), combined type  Adjustment disorder with mixed anxiety and depressed mood  PTSD (post-traumatic stress disorder)  Plan: Client Abilities/Strengths  Intelligent,  motivated, insightful  Client Treatment Preferences  Outpatient Individual therapy  Client Statement of Needs  I need some help with my anxiety and ADD.  Treatment Level  Outpatient Individual therapy  Symptoms  Excessive and/or unrealistic worry that is difficult to control occurring more days than not for at least 6  months about a number of events or activities.: No Description Entered (Status: maintained). Has been  exposed to a traumatic event involving actual or perceived threat of death or serious injury.: No  Description Entered (Status: maintained). Hypervigilance (e.g., feeling constantly on edge,  experiencing concentration difficulties, having trouble falling or staying asleep, exhibiting a general  state of irritability).: No Description Entered (Status: maintained). Impairment in social, occupational,  or other areas of functioning.: No Description Entered (Status: maintained).  Problems Addressed  Adjustment disorder with mixed anxiety and depression  Goals 1. Eliminate or reduce the negative impact trauma related symptoms have  on social, occupational, and family functioning. Objective Participate in Eye Movement Desensitization and Reprocessing (EMDR) to reduce emotional distress  related to traumatic thoughts, feelings, and images. Target Date:  12/18/2024 Frequency: Bi Weekly  Progress: 20 Modality: individual Related Interventions 1. Utilize Eye Movement Desensitization and Reprocessing (EMDR) to reduce the client's  emotional reactivity to the traumatic event and reduce PTSD symptoms. 2. Stabilize anxiety and depression level while increasing ability to function on  a daily  basis. Objective Learn and implement problem-solving strategies for realistically addressing  worries. Target Date: 12/18/2024 Frequency: Bi Weekly Progress: 30 Modality: individual Related Interventions 1. 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

## 2024-07-29 ENCOUNTER — Ambulatory Visit: Admitting: Psychology

## 2024-07-29 DIAGNOSIS — F4323 Adjustment disorder with mixed anxiety and depressed mood: Secondary | ICD-10-CM

## 2024-07-29 DIAGNOSIS — F902 Attention-deficit hyperactivity disorder, combined type: Secondary | ICD-10-CM

## 2024-07-29 DIAGNOSIS — F4310 Post-traumatic stress disorder, unspecified: Secondary | ICD-10-CM

## 2024-07-30 NOTE — Progress Notes (Signed)
 [facility] Counselor/Therapist Progress Note  Patient ID: Michelle Mclean, MRN: [id number],    Date: 07/29/2024  Time Spent: 60 minutes Time In:  10:04 Time out:  11:04  Treatment Type: Individual Therapy  Reported Symptoms: history of cutting, sadness,   Mental Status Exam: Appearance:  goth    Behavior: distracted  Motor: Normal  Speech/Language:  Normal Rate  Affect: Blunt  Mood: anxious  Thought process: normal  Thought content:   WNL  Sensory/Perceptual disturbances:   WNL  Orientation: oriented to person, place, time/date, and situation  Attention: Good  Concentration: Good  Memory: WNL  Fund of knowledge:  Good  Insight:   Good  Judgment:  Fair  Impulse Control: Fair   Risk Assessment: Danger to Self:  No Self-injurious Behavior: No Danger to Others: No Duty to Warn:no Physical Aggression / Violence:No  Access to Firearms a concern: No  Gang Involvement:No   Subjective: The patient came in for an individual therapy session in the office today.  The patient presents with a blunted affect and her mood is anxious.  The patient reports that she went over to her grandmother's house and apparently her father was there and she interacted with him and she reported that she got triggered by him at the house.  We processed this a little bit further and I explained to her that it may be that she had the kind of reaction that she had because she realized that that relationship and that way of being with him previously was not in accurate representation of her feelings about that circumstance.  We talked about it possibly being a normal reaction to having to interact with him and that she previously might have been suppressing her feelings about things.  We chose to do some EMDR around that and I think we were able to install  I am safe within my feelings.  The patient was going to let me know if she had any other reactions at her next session.  She did feel  better about her reaction and we talked about her being able to trust her feelings but that she probably was uncomfortable because she has not been able to have those feelings before. Interventions: Cognitive Behavioral Therapy, Dialectical Behavioral Therapy, Eye Movement Desensitization and Reprocessing (EMDR), and Insight-Oriented  Diagnosis:Attention deficit hyperactivity disorder (ADHD), combined type  Adjustment disorder with mixed anxiety and depressed mood  PTSD (post-traumatic stress disorder)  Plan: Client Abilities/Strengths  Intelligent,  motivated, insightful  Client Treatment Preferences  Outpatient Individual therapy  Client Statement of Needs  I need some help with my anxiety and ADD.  Treatment Level  Outpatient Individual therapy  Symptoms  Excessive and/or unrealistic worry that is difficult to control occurring more days than not for at least 6  months about a number of events or activities.: No Description Entered (Status: maintained). Has been  exposed to a traumatic event involving actual or perceived threat of death or serious injury.: No  Description Entered (Status: maintained). Hypervigilance (e.Michelle., feeling constantly on edge,  experiencing concentration difficulties, having trouble falling or staying asleep, exhibiting a general  state of irritability).: No Description Entered (Status: maintained). Impairment in social, occupational,  or other areas of functioning.: No Description Entered (Status: maintained).  Problems Addressed  Adjustment disorder with mixed anxiety and depression  Goals 1. Eliminate or reduce the negative impact trauma related symptoms have  on social, occupational, and family functioning. Objective Participate in Eye Movement Desensitization and Reprocessing (EMDR)  to reduce emotional distress  related to traumatic thoughts, feelings, and images. Target Date:  12/18/2024 Frequency: Bi Weekly  Progress: 30 Modality:  individual Related Interventions 1. Utilize Eye Movement Desensitization and Reprocessing (EMDR) to reduce the client's  emotional reactivity to the traumatic event and reduce PTSD symptoms. 2. Stabilize anxiety and depression level while increasing ability to function on a daily  basis. Objective Learn and implement problem-solving strategies for realistically addressing worries. Target Date: 12/18/2024 Frequency: Bi Weekly Progress: 40 Modality: individual Related Interventions 1. 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

## 2024-08-05 ENCOUNTER — Ambulatory Visit: Admitting: Psychology

## 2024-08-05 DIAGNOSIS — F902 Attention-deficit hyperactivity disorder, combined type: Secondary | ICD-10-CM | POA: Diagnosis not present

## 2024-08-05 DIAGNOSIS — F4310 Post-traumatic stress disorder, unspecified: Secondary | ICD-10-CM

## 2024-08-05 DIAGNOSIS — F4323 Adjustment disorder with mixed anxiety and depressed mood: Secondary | ICD-10-CM | POA: Diagnosis not present

## 2024-08-05 NOTE — Progress Notes (Signed)
 [facility] Counselor/Therapist Progress Note  Patient ID: Michelle Mclean, MRN: [id number],    Date: 08/05/2024  Time Spent: 60 minutes Time In:  11:03 Time out:  12:00  Treatment Type: Individual Therapy  Reported Symptoms: history of cutting, sadness,   Mental Status Exam: Appearance:  goth    Behavior: distracted  Motor: Normal  Speech/Language:  Normal Rate  Affect: Blunt  Mood: pleasant  Thought process: normal  Thought content:   WNL  Sensory/Perceptual disturbances:   WNL  Orientation: oriented to person, place, time/date, and situation  Attention: Good  Concentration: Good  Memory: WNL  Fund of knowledge:  Good  Insight:   Good  Judgment:  Fair  Impulse Control: Fair   Risk Assessment: Danger to Self:  No Self-injurious Behavior: No Danger to Others: No Duty to Warn:no Physical Aggression / Violence:No  Access to Firearms a concern: No  Gang Involvement:No   Subjective: The patient came in for an individual therapy session in the office today.  The patient presents with a blunted affect and her mood is pleasant.  The patient reports that she is going to [place] tomorrow to a convention with her partner.  She is excited about being able to go on this trip.  The patient did not seem to want to get involved in EMDR today and so what we did today was we identified what it was that we were going to talk about or address with the next thing that we are going to target.  In addition we also talked about her being able to trust her feelings and her reaction of not trusting her feelings because of the black-and-white way that she was brought up.  We talked about how it is normal for her to feel the things that she is feeling and I validated these for her.  We will do EMDR on her first sexual encounter during the next session and it seems that she is ready to also do the EMDR on the encounter she had with the older men and that her father introduced her  to.   Interventions: Cognitive Behavioral Therapy, Dialectical Behavioral Therapy, Eye Movement Desensitization and Reprocessing (EMDR), and Insight-Oriented  Diagnosis:Attention deficit hyperactivity disorder (ADHD), combined type  Adjustment disorder with mixed anxiety and depressed mood  PTSD (post-traumatic stress disorder)  Plan: Client Abilities/Strengths  Intelligent,  motivated, insightful  Client Treatment Preferences  Outpatient Individual therapy  Client Statement of Needs  I need some help with my anxiety and ADD.  Treatment Level  Outpatient Individual therapy  Symptoms  Excessive and/or unrealistic worry that is difficult to control occurring more days than not for at least 6  months about a number of events or activities.: No Description Entered (Status: maintained). Has been  exposed to a traumatic event involving actual or perceived threat of death or serious injury.: No  Description Entered (Status: maintained). Hypervigilance (e.g., feeling constantly on edge,  experiencing concentration difficulties, having trouble falling or staying asleep, exhibiting a general  state of irritability).: No Description Entered (Status: maintained). Impairment in social, occupational,  or other areas of functioning.: No Description Entered (Status: maintained).  Problems Addressed  Adjustment disorder with mixed anxiety and depression  Goals 1. Eliminate or reduce the negative impact trauma related symptoms have  on social, occupational, and family functioning. Objective Participate in Eye Movement Desensitization and Reprocessing (EMDR) to reduce emotional distress  related to traumatic thoughts, feelings, and images. Target Date:  12/18/2024 Frequency: Bi Weekly  Progress:  30 Modality: individual Related Interventions 1. Utilize Eye Movement Desensitization and Reprocessing (EMDR) to reduce the client's  emotional reactivity to the traumatic event and reduce PTSD  symptoms. 2. Stabilize anxiety and depression level while increasing ability to function on a daily  basis. Objective Learn and implement problem-solving strategies for realistically addressing worries. Target Date: 12/18/2024 Frequency: Bi Weekly Progress: 40 Modality: individual Related Interventions 1. 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

## 2024-08-13 ENCOUNTER — Ambulatory Visit (INDEPENDENT_AMBULATORY_CARE_PROVIDER_SITE_OTHER): Admitting: Psychology

## 2024-08-13 DIAGNOSIS — Z419 Encounter for procedure for purposes other than remedying health state, unspecified: Secondary | ICD-10-CM | POA: Diagnosis not present

## 2024-08-13 DIAGNOSIS — F902 Attention-deficit hyperactivity disorder, combined type: Secondary | ICD-10-CM | POA: Diagnosis not present

## 2024-08-13 DIAGNOSIS — F4323 Adjustment disorder with mixed anxiety and depressed mood: Secondary | ICD-10-CM

## 2024-08-13 DIAGNOSIS — F4310 Post-traumatic stress disorder, unspecified: Secondary | ICD-10-CM

## 2024-08-14 NOTE — Progress Notes (Signed)
 [facility] Counselor/Therapist Progress Note  Patient ID: LAVERNE HURSEY, MRN: [id number],    Date: 08/13/2024  Time Spent: 60 minutes Time In:  4:04 Time out:  5:04  Treatment Type: Individual Therapy  Reported Symptoms: history of cutting, sadness,   Mental Status Exam: Appearance:  goth    Behavior: distracted  Motor: Normal  Speech/Language:  Normal Rate  Affect: Blunt  Mood: pleasant  Thought process: normal  Thought content:   WNL  Sensory/Perceptual disturbances:   WNL  Orientation: oriented to person, place, time/date, and situation  Attention: Good  Concentration: Good  Memory: WNL  Fund of knowledge:  Good  Insight:   Good  Judgment:  Fair  Impulse Control: Fair   Risk Assessment: Danger to Self:  No Self-injurious Behavior: No Danger to Others: No Duty to Warn:no Physical Aggression / Violence:No  Access to Firearms a concern: No  Gang Involvement:No   Subjective: The patient came in for an individual therapy session in the office today.  The patient presents with a blunted affect and her mood is pleasant.  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  She seems very happy with this.  She did get go to [place] with her boyfriend and they went to a tattoo conference.  She reports that she felt that that was a good experience for her and feels that she can possibly utilize that in the future to make extra money and get to travel.  We did EMDR today on the first encounter she had sexually.  When we were doing the EMDR she had the realization that it is possible that her grandmother had boundary issues about talking with her about her sexuality when she was growing up and that that again was part of what contributed to her black-and-white thinking.  The patient reported a suds score of a  6 in the beginning of the session and it went down to a 0 by the end of the session.  Interventions: Cognitive Behavioral Therapy, Dialectical Behavioral Therapy, Eye Movement Desensitization and Reprocessing (EMDR), and Insight-Oriented  Diagnosis:Attention deficit hyperactivity disorder (ADHD), combined type  Adjustment disorder with mixed anxiety and depressed mood  PTSD (post-traumatic stress disorder)  Plan: Client Abilities/Strengths  Intelligent,  motivated, insightful  Client Treatment Preferences  Outpatient Individual therapy  Client Statement of Needs  I need some help with my anxiety and ADD.  Treatment Level  Outpatient Individual therapy  Symptoms  Excessive and/or unrealistic worry that is difficult to control occurring more days than not for at least 6  months about a number of events or activities.: No Description Entered (Status: maintained). Has been  exposed to a traumatic event involving actual or perceived threat of death or serious injury.: No  Description Entered (Status: maintained). Hypervigilance (e.g., feeling constantly on edge,  experiencing concentration difficulties, having trouble falling or staying asleep, exhibiting a general  state of irritability).: No Description Entered (Status: improved). Impairment in social, occupational,  or other areas of functioning.: No Description Entered (Status: improved).  Problems Addressed  Adjustment disorder with mixed anxiety and depression  Goals 1. Eliminate or reduce the negative impact trauma related symptoms have  on social, occupational, and family functioning. Objective Participate in Eye Movement Desensitization and Reprocessing (EMDR) to reduce emotional distress  related to  traumatic thoughts, feelings, and images. Target Date:  12/18/2024 Frequency: Bi Weekly  Progress: 40 Modality: individual Related Interventions 1. Utilize Eye Movement Desensitization and Reprocessing (EMDR) to reduce the  client's  emotional reactivity to the traumatic event and reduce PTSD symptoms. 2. Stabilize anxiety and depression level while increasing ability to function on a daily  basis. Objective Learn and implement problem-solving strategies for realistically addressing worries. Target Date: 12/18/2024 Frequency: Bi Weekly Progress: 50 Modality: individual Related Interventions 1. 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

## 2024-08-20 ENCOUNTER — Ambulatory Visit: Admitting: Psychology

## 2024-08-20 DIAGNOSIS — F902 Attention-deficit hyperactivity disorder, combined type: Secondary | ICD-10-CM | POA: Diagnosis not present

## 2024-08-20 DIAGNOSIS — F4323 Adjustment disorder with mixed anxiety and depressed mood: Secondary | ICD-10-CM | POA: Diagnosis not present

## 2024-08-20 DIAGNOSIS — F4310 Post-traumatic stress disorder, unspecified: Secondary | ICD-10-CM | POA: Diagnosis not present
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
## 2024-08-26 ENCOUNTER — Ambulatory Visit (INDEPENDENT_AMBULATORY_CARE_PROVIDER_SITE_OTHER): Admitting: Psychology

## 2024-08-26 DIAGNOSIS — F902 Attention-deficit hyperactivity disorder, combined type: Secondary | ICD-10-CM | POA: Diagnosis not present

## 2024-08-26 DIAGNOSIS — F4310 Post-traumatic stress disorder, unspecified: Secondary | ICD-10-CM | POA: Diagnosis not present

## 2024-08-26 DIAGNOSIS — F4323 Adjustment disorder with mixed anxiety and depressed mood: Secondary | ICD-10-CM | POA: Diagnosis not present
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
## 2024-08-27 ENCOUNTER — Encounter: Payer: Self-pay | Admitting: Family Medicine

## 2024-08-27 ENCOUNTER — Ambulatory Visit (INDEPENDENT_AMBULATORY_CARE_PROVIDER_SITE_OTHER): Admitting: Family Medicine

## 2024-08-27 VITALS — BP 113/63 | HR 64 | Ht 71.5 in | Wt 203.0 lb

## 2024-08-27 DIAGNOSIS — R41840 Attention and concentration deficit: Secondary | ICD-10-CM | POA: Diagnosis not present

## 2024-08-27 DIAGNOSIS — Q381 Ankyloglossia: Secondary | ICD-10-CM | POA: Diagnosis not present

## 2024-08-27 DIAGNOSIS — Z0000 Encounter for general adult medical examination without abnormal findings: Secondary | ICD-10-CM

## 2024-08-27 NOTE — Progress Notes (Signed)
 New Patient Office Visit   Subjective     Patient ID: Michelle Mclean, female   DOB: 06-05-1996  Age: 28 y.o. MRN: [id number]   CC:  Chief Complaint  Patient presents with   Establish Care      HPI Michelle Mclean presents to establish/transfer care. She lives with partner and roommate. She is a armed forces operational officer.    Discussed the use of AI scribe software for clinical note transcription with the patient, who gave verbal consent to proceed.  History of Present Illness Michelle Mclean is a 28 year old female who presents for a referral to an ENT specialist due to a tongue tie.  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 The tongue tie was identified in childhood but was not treated as it did not affect her speech at the time. She experiences discomfort when pointing her tongue down, feeling it 'pulling' and 'scraping' against her teeth.  She drinks alcohol once or twice a week but is trying to cut back due to bloating and weight gain. She quit nicotine use, which was previously off and on. She is sexually active with a female partner who has had a vasectomy, and she does not desire children.  She has a history of bee sting allergy and has an EpiPen. She uses clobetasol ointment for itchy patches and hives that developed after a bee sting. She was previously prescribed generic Pepcid  and Zyrtec , which worsened her mood, possibly due to PMDD, and has since stopped taking them. She has not been formally diagnosed with ADHD but has been advised by her therapist and another provider that she may have it. She is interested in pursuing a formal diagnosis if it is affordable.  She reports having regular periods once a month and has no current gynecologic issues. She had a Pap smear approximately a year and a half ago at [facility]. She is not on birth control and has no desire to start  it.     Anxiety; Attention Deficit: - Followed by a counselor. - Treatment: none - Medication side effects: n/a - SI/HI: no - Update: Stable.          08/27/2024    9:33 AM 05/28/2024   11:11 AM 12/24/2023    2:31 PM 05/01/2023    9:27 AM  Depression screen PHQ 2/9  Decreased Interest 0 0 0 0  Down, Depressed, Hopeless 0 0 0 0  PHQ - 2 Score 0 0 0 0  Altered sleeping 0 0 1   Tired, decreased energy 0 2 2   Change in appetite 0 1 0   Feeling bad or failure about yourself  0 0 0   Trouble concentrating 0 0 0   Moving slowly or fidgety/restless 0 0 0   Suicidal thoughts 0 0 0   PHQ-9 Score 0 3  3    Difficult doing work/chores Not difficult at all Somewhat difficult Somewhat difficult      Data saved with a previous flowsheet row definition      08/27/2024    9:33 AM 05/28/2024   11:12 AM 12/24/2023    2:32 PM 05/01/2023    9:28 AM  GAD 7 : Generalized Anxiety Score  Nervous, Anxious, on Edge 0 0 1 1  Control/stop worrying 0 0 0 0  Worry too much - different things 0 0 0 0  Trouble relaxing 0 1 0  0  Restless 0 1 0 0  Easily annoyed or irritable 1 2 1  0  Afraid - awful might happen 0 0 0 1  Total GAD 7 Score 1 4 2 2   Anxiety Difficulty Not difficult at all Somewhat difficult Not difficult at all Not difficult at all    Outpatient Medications Prior to Visit  Medication Sig   clobetasol ointment (TEMOVATE) 0.05 % Apply 1 Application topically 2 (two) times daily.   EPINEPHrine 0.3 mg/0.3 mL IJ SOAJ injection Inject 0.3 mg into the muscle as needed.   [DISCONTINUED] busPIRone  (BUSPAR ) 5 MG tablet Take 1 tablet (5 mg total) by mouth 2 (two) times daily.   [DISCONTINUED] cetirizine  (ZYRTEC  ALLERGY) 10 MG tablet Take 1 tablet (10 mg total) by mouth 2 (two) times daily.   [DISCONTINUED] famotidine  (PEPCID ) 20 MG tablet Take 1 tablet (20 mg total) by mouth 2 (two) times daily.   No facility-administered medications prior to visit.   History reviewed. No pertinent past  medical history.  History reviewed. No pertinent surgical history.   Family History  Problem Relation Age of Onset   ADD / ADHD Father     Social History   Socioeconomic History   Marital status: Divorced    Spouse name: Not on file   Number of children: Not on file   Years of education: Not on file   Highest education level: Some college, no degree  Occupational History   Not on file  Tobacco Use   Smoking status: Former    Current packs/day: 0.00    Types: Cigarettes    Quit date: 07/09/2015    Years since quitting: 9.1   Smokeless tobacco: Never  Substance and Sexual Activity   Alcohol use: Yes    Comment: 2 servings per week   Drug use: No   Sexual activity: Yes    Partners: Male    Birth control/protection: None    Comment: female partner w/ vasectomy  Other Topics Concern   Not on file  Social History Narrative   Not on file   Social Drivers of Health   Financial Resource Strain: Low Risk  (08/26/2024)   Overall Financial Resource Strain (CARDIA)    Difficulty of Paying Living Expenses: Not hard at all  Food Insecurity: No Food Insecurity (08/26/2024)   Hunger Vital Sign    Worried About Running Out of Food in the Last Year: Never true    Ran Out of Food in the Last Year: Never true  Transportation Needs: No Transportation Needs (08/26/2024)   PRAPARE - Administrator, Civil Service (Medical): No    Lack of Transportation (Non-Medical): No  Physical Activity: Unknown (08/26/2024)   Exercise Vital Sign    Days of Exercise per Week: Patient declined    Minutes of Exercise per Session: Not on file  Stress: No Stress Concern Present (08/26/2024)   [employer] of Occupational Health - Occupational Stress Questionnaire    Feeling of Stress: Not at all  Social Connections: Moderately Integrated (08/26/2024)   Social Connection and Isolation Panel    Frequency of Communication with Friends and Family: More than three times a week    Frequency  of Social Gatherings with Friends and Family: Twice a week    Attends Religious Services: Never    Database Administrator or Organizations: Yes    Attends Engineer, Structural: More than 4 times per year    Marital Status: Living with partner  ROS All review of systems negative except what is listed in the HPI    Objective     BP 113/63   Pulse 64   Ht 5' 11.5 (1.816 m)   Wt 203 lb (92.1 kg)   SpO2 98%   BMI 27.92 kg/m   Physical Exam Vitals reviewed.  Constitutional:      Appearance: Normal appearance.  Cardiovascular:     Rate and Rhythm: Normal rate and regular rhythm.     Heart sounds: Normal heart sounds.  Pulmonary:     Effort: Pulmonary effort is normal.     Breath sounds: Normal breath sounds.  Skin:    General: Skin is warm and dry.  Neurological:     Mental Status: She is alert and oriented to person, place, and time.  Psychiatric:        Mood and Affect: Mood normal.        Behavior: Behavior normal.        Thought Content: Thought content normal.        Judgment: Judgment normal.        Assessment & Plan:     Problem List Items Addressed This Visit       Active Problems   Attention deficit   Considering testing. Previously had trouble getting a psychiatrist - she is going to call her insurance to see who is in network and then will let me know where she wants me to send referral.       Other Visit Diagnoses       Tongue tie    -  Primary Tension and discomfort, affecting professional singing. No prior intervention. - Referred to ENT for evaluation and possible clipping of tongue tie.   Relevant Orders   Ambulatory referral to ENT     Encounter for medical examination to establish care                 Return in about 1 year (around 08/27/2025) for physical, pap.  Waddell KATHEE Mon, NP  I,Emily Lagle,acting as a scribe for Waddell KATHEE Mon, NP.,have documented all relevant documentation on the behalf of Waddell KATHEE Mon,  NP.  I, Waddell KATHEE Mon, NP, have reviewed all documentation for this visit. The documentation on 08/27/2024 for the exam, diagnosis, procedures, and orders are all accurate and complete.

## 2024-08-27 NOTE — Assessment & Plan Note (Signed)
 Considering testing. Previously had trouble getting a psychiatrist - she is going to call her insurance to see who is in network and then will let me know where she wants me to send referral.

## 2024-09-04 ENCOUNTER — Ambulatory Visit: Admitting: Psychology

## 2024-09-04 DIAGNOSIS — F902 Attention-deficit hyperactivity disorder, combined type: Secondary | ICD-10-CM | POA: Diagnosis not present

## 2024-09-04 DIAGNOSIS — F4310 Post-traumatic stress disorder, unspecified: Secondary | ICD-10-CM | POA: Diagnosis not present

## 2024-09-04 DIAGNOSIS — F4323 Adjustment disorder with mixed anxiety and depressed mood: Secondary | ICD-10-CM | POA: Diagnosis not present
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
## 2024-09-09 ENCOUNTER — Ambulatory Visit: Admitting: Psychology

## 2024-09-09 DIAGNOSIS — F4310 Post-traumatic stress disorder, unspecified: Secondary | ICD-10-CM | POA: Diagnosis not present

## 2024-09-09 DIAGNOSIS — F902 Attention-deficit hyperactivity disorder, combined type: Secondary | ICD-10-CM

## 2024-09-09 DIAGNOSIS — F4323 Adjustment disorder with mixed anxiety and depressed mood: Secondary | ICD-10-CM

## 2024-09-09 NOTE — Progress Notes (Signed)
 [facility] Counselor/Therapist Progress Note  Patient ID: Michelle Mclean, MRN: [id number],    Date: 09/09/2024  Time Spent: 58 minutes Time In:  10:02 Time out:  11:00  Treatment Type: Individual Therapy  Reported Symptoms: history of cutting, sadness,   Mental Status Exam: Appearance:  goth    Behavior: distracted  Motor: Normal  Speech/Language:  Normal Rate  Affect: Blunt  Mood: pleasant  Thought process: normal  Thought content:   WNL  Sensory/Perceptual disturbances:   WNL  Orientation: oriented to person, place, time/date, and situation  Attention: Good  Concentration: Good  Memory: WNL  Fund of knowledge:  Good  Insight:   Good  Judgment:  Fair  Impulse Control: Fair   Risk Assessment: Danger to Self:  No Self-injurious Behavior: No Danger to Others: No Duty to Warn:no Physical Aggression / Violence:No  Access to Firearms a concern: No  Gang Involvement:No   Subjective: The patient participated in an individual therapy session visit today.  The patient gave verbal consent for the session to be on caregility and she is aware of the limitations of telehealth.  The patient was in her home alone and the therapist was in the office.  The patient reports that she has been a little bit reactive since I saw her the last time and we did the EMDR.  She states that she has smoked and she was not happy about doing that so she has stopped.  I do believe that it was probably reactive and based out of fear.  We were able to talk about the negative cognition that we came up with of  I am in danger as being the definite one that we want to target with EMDR.  We worked today on identifying the positive cognition that we wanted to replace it with and we came up with I am safe in my vulnerability.  We will plan to do the EMDR next week when she comes into the office.  Interventions: Cognitive Behavioral Therapy, Dialectical Behavioral Therapy, Eye Movement  Desensitization and Reprocessing (EMDR), and Insight-Oriented  Diagnosis:Attention deficit hyperactivity disorder (ADHD), combined type  Adjustment disorder with mixed anxiety and depressed mood  PTSD (post-traumatic stress disorder)  Plan: Client Abilities/Strengths  Intelligent,  motivated, insightful  Client Treatment Preferences  Outpatient Individual therapy  Client Statement of Needs  I need some help with my anxiety and ADD.  Treatment Level  Outpatient Individual therapy  Symptoms  Excessive and/or unrealistic worry that is difficult to control occurring more days than not for at least 6  months about a number of events or activities.: No Description Entered (Status: maintained). Has been  exposed to a traumatic event involving actual or perceived threat of death or serious injury.: No  Description Entered (Status: maintained). Hypervigilance (e.g., feeling constantly on edge,  experiencing concentration difficulties, having trouble falling or staying asleep, exhibiting a general  state of irritability).: No Description Entered (Status: improved). Impairment in social, occupational,  or other areas of functioning.: No Description Entered (Status: improved).  Problems Addressed  Adjustment disorder with mixed anxiety and depression  Goals 1. Eliminate or reduce the negative impact trauma related symptoms have  on social, occupational, and family functioning. Objective Participate in Eye Movement Desensitization and Reprocessing (EMDR) to reduce emotional distress  related to traumatic thoughts, feelings, and images. Target Date:  12/18/2024 Frequency: Bi Weekly  Progress: 50 Modality: individual Related Interventions 1. Utilize Eye Movement Desensitization and Reprocessing (EMDR) to reduce the client's  emotional  reactivity to the traumatic event and reduce PTSD symptoms. 2. Stabilize anxiety and depression level while increasing ability to function on a daily   basis. Objective Learn and implement problem-solving strategies for realistically addressing worries. Target Date: 12/18/2024 Frequency: Bi Weekly Progress: 60 Modality: individual Related Interventions 1. 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

## 2024-09-16 ENCOUNTER — Ambulatory Visit (INDEPENDENT_AMBULATORY_CARE_PROVIDER_SITE_OTHER): Admitting: Otolaryngology

## 2024-09-16 ENCOUNTER — Encounter (INDEPENDENT_AMBULATORY_CARE_PROVIDER_SITE_OTHER): Payer: Self-pay | Admitting: Otolaryngology

## 2024-09-16 VITALS — BP 124/78 | HR 87 | Ht 71.5 in | Wt 203.0 lb

## 2024-09-16 DIAGNOSIS — Q381 Ankyloglossia: Secondary | ICD-10-CM

## 2024-09-16 NOTE — Progress Notes (Signed)
 Reason for Consult: Tongue-tie Referring Physician: Dr. Almarie Joesph Mclean Michelle is an 28 y.o. female.  HPI: Patient is here insisting on having her frenulum divided.  She has had it for her whole life but now she started doing more professional singing and her speech/voice coach is saying it is inhibiting her full tongue movement and enunciation of bowels.  She does feel like it does bind her tongue.  She has no drainage or pain in the area.  No dysphagia or dyne aphasia.  No hoarseness.  No past medical history on file.  No past surgical history on file.  Family History  Problem Relation Age of Onset   ADD / ADHD Father     Social History:  reports that she quit smoking about 9 years ago. Her smoking use included cigarettes. She has never used smokeless tobacco. She reports current alcohol use. She reports that she does not use drugs.  Allergies: Allergies[1]   No results found for this or any previous visit (from the past 48 hours).  No results found.  ROS Blood pressure 124/78, pulse 87, height 5' 11.5 (1.816 m), weight 203 lb (92.1 kg), SpO2 98%. Physical Exam Constitutional:      Appearance: Normal appearance.  HENT:     Head: Normocephalic and atraumatic.     Right Ear: Tympanic membrane is without lesions and middle ear aerated, ear canal and external ear normal.     Left Ear: Tympanic membrane is without lesions and middle ear aerated, ear canal and external ear normal.     Nose: Nose without deviation of septum.  Turbinates with mild hypertrophy, No significant swelling or masses.     Oral cavity/oropharynx: There is a thin frenulum that is attached to the anterior tongue but does not go up onto the alveolar ridge.  It does seem to limit the upward movement.  Mucous membranes are moist. No lesions or masses    Larynx: normal voice. Mirror attempted without success    Eyes:     Extraocular Movements: Extraocular movements intact.     Conjunctiva/sclera: Conjunctivae  normal.     Pupils: Pupils are equal, round, and reactive to light.  Cardiovascular:     Rate and Rhythm: Normal rate.  Pulmonary:     Effort: Pulmonary effort is normal.  Musculoskeletal:     Cervical back: Normal range of motion and neck supple. No rigidity.  Lymphadenopathy:     Cervical: No cervical adenopathy or masses.salivary glands without lesions. .     Salivary glands- no mass or swelling Neurological:     Mental Status: He is alert. CN 2-12 intact. No nystagmus   Frenulectomy  We discussed the risk, benefits, and options.  It is possible this would negatively impact her singing voice and ability.  She wants to proceed anyway.  All her questions were answered and consent was obtained.  The frenulum 1 was sprayed with Cetacaine and then injected 1% lidocaine with 1-100,000 epinephrine.  An ophthalmic cautery was used to divide the frenulum to the muscle but not into the muscle.  It was divided above the Wharton's ducts which were not involved and the cautery was superior to them.  She tolerated the procedure well.  Good hemostasis.   Assessment/Plan: Ankyloglossia-she wanted this performed despite her age.  She says singing is a problem and she did understand prior to cutting the frenulum that its theoretically it could negatively impact her singing.  She wanted to proceed knowing that.  She  tolerated the frenulectomy well.  She will follow-up as needed.  Michelle Mclean 09/16/2024, 2:36 PM        [1]  Allergies Allergen Reactions   Bee Venom Hives, Itching and Swelling

## 2024-09-17 ENCOUNTER — Ambulatory Visit: Admitting: Psychology

## 2024-09-17 DIAGNOSIS — F902 Attention-deficit hyperactivity disorder, combined type: Secondary | ICD-10-CM

## 2024-09-17 DIAGNOSIS — F4310 Post-traumatic stress disorder, unspecified: Secondary | ICD-10-CM | POA: Diagnosis not present

## 2024-09-17 DIAGNOSIS — F4323 Adjustment disorder with mixed anxiety and depressed mood: Secondary | ICD-10-CM

## 2024-09-19 NOTE — Progress Notes (Signed)
 " [facility] Counselor/Therapist Progress Note  Patient ID: Michelle Mclean, MRN: [id number],    Date: 09/17/2024  Time Spent: 60 minutes Time In:  9:00 Time out:  10:00  Treatment Type: Individual Therapy  Reported Symptoms: history of cutting, sadness,   Mental Status Exam: Appearance:  goth    Behavior: distracted  Motor: Normal  Speech/Language:  Normal Rate  Affect: Blunt  Mood: pleasant  Thought process: normal  Thought content:   WNL  Sensory/Perceptual disturbances:   WNL  Orientation: oriented to person, place, time/date, and situation  Attention: Good  Concentration: Good  Memory: WNL  Fund of knowledge:  Good  Insight:   Good  Judgment:  Fair  Impulse Control: Fair   Risk Assessment: Danger to Self:  No Self-injurious Behavior: No Danger to Others: No Duty to Warn:no Physical Aggression / Violence:No  Access to Firearms a concern: No  Gang Involvement:No   Subjective: The patient participated in an individual therapy session in the office today.  The patient presents as pleasant and cooperative.  She seems a little anxious today.  We talked today about doing EMDR probably after the holidays as I wanted to make sure that any emotional things that are going on during the holidays do not exacerbate negative symptoms.  The patient reports that she feels like she is learning a lot about herself and she does feel good about the cognitions that we decided to target with the next EMDR session that we do.  I will continue to see her through the EMDR and then she may graduate in I will instruct her on what to do to get assistance if needed after I retire.  At this point in time she seems to be doing better and learning a lot about herself and is less reactive.  One of the things that I am a little concerned about is that she feels that stability might be too boring for her.  We will have to work with her on how to be okay with not having chaos in her  life. Interventions: Cognitive Behavioral Therapy, Dialectical Behavioral Therapy, Eye Movement Desensitization and Reprocessing (EMDR), and Insight-Oriented  Diagnosis:Attention deficit hyperactivity disorder (ADHD), combined type  Adjustment disorder with mixed anxiety and depressed mood  PTSD (post-traumatic stress disorder)  Plan: Client Abilities/Strengths  Intelligent,  motivated, insightful  Client Treatment Preferences  Outpatient Individual therapy  Client Statement of Needs  I need some help with my anxiety and ADD.  Treatment Level  Outpatient Individual therapy  Symptoms  Excessive and/or unrealistic worry that is difficult to control occurring more days than not for at least 6  months about a number of events or activities.: No Description Entered (Status: maintained). Has been  exposed to a traumatic event involving actual or perceived threat of death or serious injury.: No  Description Entered (Status: maintained). Hypervigilance (e.Mclean., feeling constantly on edge,  experiencing concentration difficulties, having trouble falling or staying asleep, exhibiting a general  state of irritability).: No Description Entered (Status: improved). Impairment in social, occupational,  or other areas of functioning.: No Description Entered (Status: improved).  Problems Addressed  Adjustment disorder with mixed anxiety and depression  Goals 1. Eliminate or reduce the negative impact trauma related symptoms have  on social, occupational, and family functioning. Objective Participate in Eye Movement Desensitization and Reprocessing (EMDR) to reduce emotional distress  related to traumatic thoughts, feelings, and images. Target Date:  12/18/2024 Frequency: Bi Weekly  Progress: 50 Modality: individual Related Interventions  1. Utilize Eye Movement Desensitization and Reprocessing (EMDR) to reduce the client's  emotional reactivity to the traumatic event and reduce PTSD symptoms. 2.  Stabilize anxiety and depression level while increasing ability to function on a daily  basis. Objective Learn and implement problem-solving strategies for realistically addressing worries. Target Date: 12/18/2024 Frequency: Bi Weekly Progress: 60 Modality: individual Related Interventions 1. 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

## 2024-10-01 ENCOUNTER — Ambulatory Visit: Admitting: Psychology

## 2024-10-01 DIAGNOSIS — F4310 Post-traumatic stress disorder, unspecified: Secondary | ICD-10-CM

## 2024-10-01 DIAGNOSIS — F902 Attention-deficit hyperactivity disorder, combined type: Secondary | ICD-10-CM

## 2024-10-01 DIAGNOSIS — F4323 Adjustment disorder with mixed anxiety and depressed mood: Secondary | ICD-10-CM | POA: Diagnosis not present
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
## 2024-10-10 ENCOUNTER — Ambulatory Visit: Admitting: Psychology

## 2024-10-10 DIAGNOSIS — F902 Attention-deficit hyperactivity disorder, combined type: Secondary | ICD-10-CM | POA: Diagnosis not present

## 2024-10-10 DIAGNOSIS — F4323 Adjustment disorder with mixed anxiety and depressed mood: Secondary | ICD-10-CM | POA: Diagnosis not present

## 2024-10-10 DIAGNOSIS — F4310 Post-traumatic stress disorder, unspecified: Secondary | ICD-10-CM | POA: Diagnosis not present

## 2024-10-12 NOTE — Progress Notes (Signed)
 " [facility] Counselor/Therapist Progress Note  Patient ID: Michelle Mclean, MRN: [id number],    Date: 10/10/2024  Time Spent: 60 minutes Time In:  3:00 Time out:  4:00  Treatment Type: Individual Therapy  Reported Symptoms: history of cutting, sadness,   Mental Status Exam: Appearance:  goth    Behavior: distracted  Motor: Normal  Speech/Language:  Normal Rate  Affect: Blunt  Mood: Pleasant but anxious  Thought process: normal  Thought content:   WNL  Sensory/Perceptual disturbances:   WNL  Orientation: oriented to person, place, time/date, and situation  Attention: Good  Concentration: Good  Memory: WNL  Fund of knowledge:  Good  Insight:   Good  Judgment:  Fair  Impulse Control: Fair   Risk Assessment: Danger to Self:  No Self-injurious Behavior: No Danger to Others: No Duty to Warn:no Physical Aggression / Violence:No  Access to Firearms a concern: No  Gang Involvement:No   Subjective: The patient participated in an individual therapy session in the office today.  The patient presents as a little anxious today.  We talked about her difficulty with extremes because she was talking about being frustrated because she wishes that she could be more sexual with her boyfriend.  We discussed some of the dynamics that are going on. The patient also is getting ready to start her menstrual cycle and I think this is clouding her emotions somewhat.  We talked about how she has always lived with extremes and that we may choose to work on this with EMDR more if needed, but we will evaluate it when she is less emotional.   Interventions: Cognitive Behavioral Therapy, Dialectical Behavioral Therapy, Eye Movement Desensitization and Reprocessing (EMDR), and Insight-Oriented  Diagnosis:Attention deficit hyperactivity disorder (ADHD), combined type  Adjustment disorder with mixed anxiety and depressed mood  PTSD (post-traumatic stress disorder)  Plan: Client  Abilities/Strengths  Intelligent,  motivated, insightful  Client Treatment Preferences  Outpatient Individual therapy  Client Statement of Needs  I need some help with my anxiety and ADD.  Treatment Level  Outpatient Individual therapy  Symptoms  Excessive and/or unrealistic worry that is difficult to control occurring more days than not for at least 6  months about a number of events or activities.: No Description Entered (Status: maintained). Has been  exposed to a traumatic event involving actual or perceived threat of death or serious injury.: No  Description Entered (Status: maintained). Hypervigilance (e.Mclean., feeling constantly on edge,  experiencing concentration difficulties, having trouble falling or staying asleep, exhibiting a general  state of irritability).: No Description Entered (Status: improved). Impairment in social, occupational,  or other areas of functioning.: No Description Entered (Status: improved).  Problems Addressed  Adjustment disorder with mixed anxiety and depression  Goals 1. Eliminate or reduce the negative impact trauma related symptoms have  on social, occupational, and family functioning. Objective Participate in Eye Movement Desensitization and Reprocessing (EMDR) to reduce emotional distress  related to traumatic thoughts, feelings, and images. Target Date:  12/18/2024 Frequency: Bi Weekly  Progress: 60 Modality: individual Related Interventions 1. Utilize Eye Movement Desensitization and Reprocessing (EMDR) to reduce the client's  emotional reactivity to the traumatic event and reduce PTSD symptoms. 2. Stabilize anxiety and depression level while increasing ability to function on a daily  basis. Objective Learn and implement problem-solving strategies for realistically addressing worries. Target Date: 12/18/2024 Frequency: Bi Weekly Progress: 70 Modality: individual Related Interventions 1. 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

## 2024-10-14 ENCOUNTER — Ambulatory Visit: Admitting: Psychology

## 2024-10-14 DIAGNOSIS — F902 Attention-deficit hyperactivity disorder, combined type: Secondary | ICD-10-CM | POA: Diagnosis not present

## 2024-10-14 DIAGNOSIS — F4310 Post-traumatic stress disorder, unspecified: Secondary | ICD-10-CM | POA: Diagnosis not present

## 2024-10-14 DIAGNOSIS — F4323 Adjustment disorder with mixed anxiety and depressed mood: Secondary | ICD-10-CM
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
## 2024-10-21 ENCOUNTER — Ambulatory Visit: Admitting: Psychology

## 2024-10-21 DIAGNOSIS — F902 Attention-deficit hyperactivity disorder, combined type: Secondary | ICD-10-CM | POA: Diagnosis not present

## 2024-10-21 DIAGNOSIS — F4310 Post-traumatic stress disorder, unspecified: Secondary | ICD-10-CM | POA: Diagnosis not present

## 2024-10-21 DIAGNOSIS — F4323 Adjustment disorder with mixed anxiety and depressed mood: Secondary | ICD-10-CM | POA: Diagnosis not present

## 2024-10-21 NOTE — Progress Notes (Signed)
 " [facility] Counselor/Therapist Progress Note  Patient ID: SALEHA KALP, MRN: [id number],    Date: 10/21/2024  Time Spent: 53 minutes Time In:  9:08 Time out:  10:01  Treatment Type: Individual Therapy  Reported Symptoms: history of cutting, sadness,   Mental Status Exam: Appearance:  goth    Behavior: distracted  Motor: Normal  Speech/Language:  Normal Rate  Affect: Blunt  Mood: Pleasant   Thought process: normal  Thought content:   WNL  Sensory/Perceptual disturbances:   WNL  Orientation: oriented to person, place, time/date, and situation  Attention: Good  Concentration: Good  Memory: WNL  Fund of knowledge:  Good  Insight:   Good  Judgment:  Fair  Impulse Control: Fair   Risk Assessment: Danger to Self:  No Self-injurious Behavior: No Danger to Others: No Duty to Warn:no Physical Aggression / Violence:No  Access to Firearms a concern: No  Gang Involvement:No   Subjective: The patient participated in an individual therapy session in the office today.  The patient presents as pleasant and cooperative today.  The patient was a little late today getting here and she reported that she is having trouble with her ADD today.  We talked about her struggles with having structure and her all or nothing thinking.  We talked about how she could get more balance in her life by doing some things differently and we discussed how to do that.  We talked about her just taking 1 thing at a time and working possibly on just getting up at 7 in the morning and doing her skin care routine first and maybe some journaling.  Then we talked about adding in yoga and other self-care things once she gets that to be a habit.  Interventions: Cognitive Behavioral Therapy, Dialectical Behavioral Therapy, Eye Movement Desensitization and Reprocessing (EMDR), and Insight-Oriented  Diagnosis:Attention deficit hyperactivity disorder (ADHD), combined type  Adjustment disorder with mixed  anxiety and depressed mood  PTSD (post-traumatic stress disorder)  Plan: Client Abilities/Strengths  Intelligent,  motivated, insightful  Client Treatment Preferences  Outpatient Individual therapy  Client Statement of Needs  I need some help with my anxiety and ADD.  Treatment Level  Outpatient Individual therapy  Symptoms  Excessive and/or unrealistic worry that is difficult to control occurring more days than not for at least 6  months about a number of events or activities.: No Description Entered (Status: maintained). Has been  exposed to a traumatic event involving actual or perceived threat of death or serious injury.: No  Description Entered (Status: maintained). Hypervigilance (e.g., feeling constantly on edge,  experiencing concentration difficulties, having trouble falling or staying asleep, exhibiting a general  state of irritability).: No Description Entered (Status: improved). Impairment in social, occupational,  or other areas of functioning.: No Description Entered (Status: improved).  Problems Addressed  Adjustment disorder with mixed anxiety and depression  Goals 1. Eliminate or reduce the negative impact trauma related symptoms have  on social, occupational, and family functioning. Objective Participate in Eye Movement Desensitization and Reprocessing (EMDR) to reduce emotional distress  related to traumatic thoughts, feelings, and images. Target Date:  12/18/2025 Frequency: Bi Weekly  Progress: 70 Modality: individual Related Interventions 1. Utilize Eye Movement Desensitization and Reprocessing (EMDR) to reduce the client's  emotional reactivity to the traumatic event and reduce PTSD symptoms. 2. Stabilize anxiety and depression level while increasing ability to function on a daily  basis. Objective Learn and implement problem-solving strategies for realistically addressing worries. Target Date: 12/18/2025 Frequency: Bi  Weekly Progress: 70 Modality:  individual Related Interventions 1. 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

## 2024-10-28 ENCOUNTER — Ambulatory Visit: Admitting: Psychology

## 2024-10-28 DIAGNOSIS — F902 Attention-deficit hyperactivity disorder, combined type: Secondary | ICD-10-CM

## 2024-10-28 DIAGNOSIS — F4323 Adjustment disorder with mixed anxiety and depressed mood: Secondary | ICD-10-CM

## 2024-10-28 DIAGNOSIS — F4310 Post-traumatic stress disorder, unspecified: Secondary | ICD-10-CM | POA: Diagnosis not present

## 2024-10-28 NOTE — Progress Notes (Signed)
 " [facility] Counselor/Therapist Progress Note  Patient ID: Michelle Mclean, MRN: [id number],    Date: 10/28/2024  Time Spent: 58 minutes Time In:  11:02 Time out:  12:00  Treatment Type: Individual Therapy  Reported Symptoms: history of cutting, sadness,   Mental Status Exam: Appearance:  goth    Behavior: distracted  Motor: Normal  Speech/Language:  Normal Rate  Affect: Blunt  Mood: Pleasant   Thought process: normal  Thought content:   WNL  Sensory/Perceptual disturbances:   WNL  Orientation: oriented to person, place, time/date, and situation  Attention: Good  Concentration: Good  Memory: WNL  Fund of knowledge:  Good  Insight:   Good  Judgment:  Fair  Impulse Control: Fair   Risk Assessment: Danger to Self:  No Self-injurious Behavior: No Danger to Others: No Duty to Warn:no Physical Aggression / Violence:No  Access to Firearms a concern: No  Gang Involvement:No   Subjective: The patient participated in an individual therapy session in the office today.  The patient presents as pleasant and cooperative today.  The patient reports that is her birthday today and apparently she had a good weekend however some of her friends who came over ended up breaking up while they were snowed in her house so she reported that that was a bit uncomfortable for her.  We talked today about how she is managing things and I do believe she is doing a great job right now of understanding her self and not getting to caught up and reacting and she feels more healthy.  She continues to work on trying to get herself into more of a routine and use the structure to help her balance herself out.  We talked today a little bit about the poly vagal theory and about how some of the reactivity that she has had in the past was related to not having a way to regulate herself.  She seems to be doing well and we do have about 3 more sessions scheduled which we will keep just to make sure that she  is in a good place when she graduates.   Interventions: Cognitive Behavioral Therapy, Dialectical Behavioral Therapy, Eye Movement Desensitization and Reprocessing (EMDR), and Insight-Oriented  Diagnosis:Attention deficit hyperactivity disorder (ADHD), combined type  Adjustment disorder with mixed anxiety and depressed mood  PTSD (post-traumatic stress disorder)  Plan: Client Abilities/Strengths  Intelligent,  motivated, insightful  Client Treatment Preferences  Outpatient Individual therapy  Client Statement of Needs  I need some help with my anxiety and ADD.  Treatment Level  Outpatient Individual therapy  Symptoms  Excessive and/or unrealistic worry that is difficult to control occurring more days than not for at least 6  months about a number of events or activities.: No Description Entered (Status: maintained). Has been  exposed to a traumatic event involving actual or perceived threat of death or serious injury.: No  Description Entered (Status: maintained). Hypervigilance (e.g., feeling constantly on edge,  experiencing concentration difficulties, having trouble falling or staying asleep, exhibiting a general  state of irritability).: No Description Entered (Status: improved). Impairment in social, occupational,  or other areas of functioning.: No Description Entered (Status: improved).  Problems Addressed  Adjustment disorder with mixed anxiety and depression  Goals 1. Eliminate or reduce the negative impact trauma related symptoms have  on social, occupational, and family functioning. Objective Participate in Eye Movement Desensitization and Reprocessing (EMDR) to reduce emotional distress  related to traumatic thoughts, feelings, and images. Target Date:  12/18/2025  Frequency: Bi Weekly  Progress: 80 Modality: individual Related Interventions 1. Utilize Eye Movement Desensitization and Reprocessing (EMDR) to reduce the client's  emotional reactivity to the traumatic  event and reduce PTSD symptoms. 2. Stabilize anxiety and depression level while increasing ability to function on a daily  basis. Objective Learn and implement problem-solving strategies for realistically addressing worries. Target Date: 12/18/2025 Frequency: Bi Weekly Progress: 80 Modality: individual Related Interventions 1. Teach the client problem-solving strategies involving specifically defining a problem,  generating options for addressing it, evaluating the pros and cons of each option, selecting and  implementing an optional action, and reevaluating and refining the action  Diagnosis PTSD F43.23  Adjustment Disorder with mixed anxiety and depression Disorder  Conditions For Discharge Achievement of treatment goals and objectives  Michelle Stmarie G Jeyson Deshotel, LCSW                                                "

## 2024-10-29 ENCOUNTER — Ambulatory Visit
Admission: EM | Admit: 2024-10-29 | Discharge: 2024-10-29 | Disposition: A | Discharge status: Home / Self Care | Attending: Family Medicine | Admitting: Family Medicine

## 2024-10-29 ENCOUNTER — Other Ambulatory Visit: Payer: Self-pay

## 2024-10-29 DIAGNOSIS — Z23 Encounter for immunization: Secondary | ICD-10-CM | POA: Diagnosis not present

## 2024-10-29 DIAGNOSIS — T148XXA Other injury of unspecified body region, initial encounter: Secondary | ICD-10-CM | POA: Diagnosis not present

## 2024-10-29 MED ORDER — TETANUS-DIPHTH-ACELL PERTUSSIS 5-2-15.5 LF-MCG/0.5 IM SUSP
0.5000 mL | Freq: Once | INTRAMUSCULAR | Status: AC
  Administered 2024-10-29: 0.5 mL via INTRAMUSCULAR

## 2024-10-29 NOTE — ED Triage Notes (Signed)
 Has c/o puncture wound yesterday to left wrist by rusty staple. Has not had tetanus shot since grade school. Wanting tetanus shot.

## 2024-10-29 NOTE — Discharge Instructions (Addendum)
 You were seen today after a puncture wound to your wrist.  We have updated your Tdap vaccine today.  Follow up as needed, or if you develop worsening pain or signs of infection.

## 2024-10-29 NOTE — ED Provider Notes (Signed)
 " [facility]    CSN: [id number] Arrival date & time: 10/29/24  1153      History   Chief Complaint No chief complaint on file.   HPI Michelle Mclean is a 29 y.o. female.   Patient is here for a puncture wound to the left wrist yesterday.  He was punctured by a rusty staple.  Minimal pain at this time.  He is not sure of last tdap, and would like this updated today.  No other issues at this time.        No past medical history on file.  Patient Active Problem List   Diagnosis Date Noted   Alcohol use 11/12/2023   Anxiety 05/01/2023   Attention deficit 05/01/2023   Jaw pain 05/01/2023    No past surgical history on file.  OB History   No obstetric history on file.      Home Medications    Prior to Admission medications  Medication Sig Start Date End Date Taking? Authorizing Provider  clobetasol ointment (TEMOVATE) 0.05 % Apply 1 Application topically 2 (two) times daily. 04/11/24 04/11/25  [provider]  EPINEPHrine 0.3 mg/0.3 mL IJ SOAJ injection Inject 0.3 mg into the muscle as needed. 04/10/24   [provider]    Family History Family History  Problem Relation Age of Onset   ADD / ADHD Father     Social History Social History[1]   Allergies   Bee venom   Review of Systems Review of Systems  Constitutional: Negative.   HENT: Negative.    Respiratory: Negative.    Cardiovascular: Negative.   Gastrointestinal: Negative.   Skin:  Positive for wound.     Physical Exam Triage Vital Signs ED Triage Vitals  Encounter Vitals Group     BP 10/29/24 1159 130/69     Girls Systolic BP Percentile --      Girls Diastolic BP Percentile --      Boys Systolic BP Percentile --      Boys Diastolic BP Percentile --      Pulse Rate 10/29/24 1159 71     Resp 10/29/24 1159 16     Temp 10/29/24 1159 98.3 F (36.8 C)     Temp src --      SpO2 10/29/24 1159 95 %     Weight --      Height --      Head Circumference --       Peak Flow --      Pain Score 10/29/24 1202 1     Pain Loc --      Pain Education --      Exclude from Growth Chart --    No data found.  Updated Vital Signs BP 130/69   Pulse 71   Temp 98.3 F (36.8 C)   Resp 16   SpO2 95%   Visual Acuity Right Eye Distance:   Left Eye Distance:   Bilateral Distance:    Right Eye Near:   Left Eye Near:    Bilateral Near:     Physical Exam Constitutional:      Appearance: Normal appearance.  Skin:    Comments: Small puncture wound noted to the anterior medial wrist;  no erythema noted;  minimally tender;  full rom without pain or limitation  Neurological:     Mental Status: She is alert.      UC Treatments / Results  Labs (all labs ordered are listed, but only abnormal results  are displayed) Labs Reviewed - No data to display  EKG   Radiology No results found.  Procedures Procedures (including critical care time)  Medications Ordered in UC Medications  Tdap (ADACEL ) injection 0.5 mL (has no administration in time range)    Initial Impression / Assessment and Plan / UC Course  I have reviewed the triage vital signs and the nursing notes.  Pertinent labs & imaging results that were available during my care of the patient were reviewed by me and considered in my medical decision making (see chart for details).    Final Clinical Impressions(s) / UC Diagnoses   Final diagnoses:  Puncture wound  Need for Tdap vaccination     Discharge Instructions      You were seen today after a puncture wound to your wrist.  We have updated your Tdap vaccine today.  Follow up as needed, or if you develop worsening pain or signs of infection.     ED Prescriptions   None    PDMP not reviewed this encounter.    [1]  Social History Tobacco Use   Smoking status: Former    Current packs/day: 0.00    Types: Cigarettes    Quit date: 07/09/2015    Years since quitting: 9.3   Smokeless tobacco: Never  Substance Use  Topics   Alcohol use: Yes    Comment: 2 servings per week   Drug use: No     Darral Longs, MD 10/29/24 1209  "

## 2024-11-04 ENCOUNTER — Ambulatory Visit: Admitting: Psychology

## 2024-11-04 DIAGNOSIS — F902 Attention-deficit hyperactivity disorder, combined type: Secondary | ICD-10-CM | POA: Diagnosis not present

## 2024-11-04 DIAGNOSIS — F4310 Post-traumatic stress disorder, unspecified: Secondary | ICD-10-CM | POA: Diagnosis not present

## 2024-11-04 DIAGNOSIS — F4323 Adjustment disorder with mixed anxiety and depressed mood: Secondary | ICD-10-CM | POA: Diagnosis not present

## 2024-11-11 ENCOUNTER — Ambulatory Visit: Admitting: Psychology

## 2024-11-18 ENCOUNTER — Ambulatory Visit: Admitting: Psychology

## 2024-12-09 ENCOUNTER — Ambulatory Visit: Admitting: Psychology
